# Patient Record
Sex: Female | Born: 1970 | Race: White | Hispanic: No | Marital: Single | Smoking: Never smoker
Health system: Southern US, Community
[De-identification: ages and names within clinical notes are randomized; demographics above are authoritative.]

## PROBLEM LIST (undated history)

## (undated) DIAGNOSIS — E079 Disorder of thyroid, unspecified: Secondary | ICD-10-CM

## (undated) DIAGNOSIS — E039 Hypothyroidism, unspecified: Secondary | ICD-10-CM

## (undated) DIAGNOSIS — I10 Essential (primary) hypertension: Secondary | ICD-10-CM

## (undated) HISTORY — PX: THYROIDECTOMY: SHX17

---

## 1997-06-26 ENCOUNTER — Inpatient Hospital Stay (HOSPITAL_COMMUNITY): Admission: AD | Admit: 1997-06-26 | Discharge: 1997-06-26 | Payer: Self-pay | Admitting: Obstetrics and Gynecology

## 1997-08-11 ENCOUNTER — Inpatient Hospital Stay (HOSPITAL_COMMUNITY): Admission: AD | Admit: 1997-08-11 | Discharge: 1997-08-11 | Payer: Self-pay | Admitting: Obstetrics and Gynecology

## 1997-09-05 ENCOUNTER — Inpatient Hospital Stay (HOSPITAL_COMMUNITY): Admission: AD | Admit: 1997-09-05 | Discharge: 1997-09-05 | Payer: Self-pay | Admitting: *Deleted

## 1997-09-07 ENCOUNTER — Inpatient Hospital Stay (HOSPITAL_COMMUNITY): Admission: AD | Admit: 1997-09-07 | Discharge: 1997-09-09 | Payer: Self-pay | Admitting: Obstetrics and Gynecology

## 1997-10-12 ENCOUNTER — Other Ambulatory Visit: Admission: RE | Admit: 1997-10-12 | Discharge: 1997-10-12 | Payer: Self-pay | Admitting: Obstetrics and Gynecology

## 1998-07-29 ENCOUNTER — Emergency Department (HOSPITAL_COMMUNITY): Admission: EM | Admit: 1998-07-29 | Discharge: 1998-07-29 | Payer: Self-pay | Admitting: Emergency Medicine

## 1999-01-09 ENCOUNTER — Other Ambulatory Visit: Admission: RE | Admit: 1999-01-09 | Discharge: 1999-01-09 | Payer: Self-pay | Admitting: Obstetrics and Gynecology

## 1999-03-15 ENCOUNTER — Other Ambulatory Visit: Admission: RE | Admit: 1999-03-15 | Discharge: 1999-03-15 | Payer: Self-pay | Admitting: *Deleted

## 1999-05-09 ENCOUNTER — Other Ambulatory Visit: Admission: RE | Admit: 1999-05-09 | Discharge: 1999-05-09 | Payer: Self-pay | Admitting: *Deleted

## 1999-06-01 ENCOUNTER — Ambulatory Visit (HOSPITAL_COMMUNITY): Admission: RE | Admit: 1999-06-01 | Discharge: 1999-06-01 | Payer: Self-pay | Admitting: *Deleted

## 1999-06-01 ENCOUNTER — Encounter (INDEPENDENT_AMBULATORY_CARE_PROVIDER_SITE_OTHER): Payer: Self-pay | Admitting: *Deleted

## 2000-01-15 ENCOUNTER — Other Ambulatory Visit: Admission: RE | Admit: 2000-01-15 | Discharge: 2000-01-15 | Payer: Self-pay | Admitting: Obstetrics and Gynecology

## 2000-03-18 ENCOUNTER — Ambulatory Visit (HOSPITAL_COMMUNITY): Admission: RE | Admit: 2000-03-18 | Discharge: 2000-03-18 | Payer: Self-pay | Admitting: *Deleted

## 2000-07-27 ENCOUNTER — Emergency Department (HOSPITAL_COMMUNITY): Admission: EM | Admit: 2000-07-27 | Discharge: 2000-07-27 | Payer: Self-pay | Admitting: *Deleted

## 2000-10-13 ENCOUNTER — Other Ambulatory Visit: Admission: RE | Admit: 2000-10-13 | Discharge: 2000-10-13 | Payer: Self-pay | Admitting: Obstetrics and Gynecology

## 2001-01-28 ENCOUNTER — Emergency Department (HOSPITAL_COMMUNITY): Admission: EM | Admit: 2001-01-28 | Discharge: 2001-01-28 | Payer: Self-pay | Admitting: Emergency Medicine

## 2001-04-22 ENCOUNTER — Inpatient Hospital Stay (HOSPITAL_COMMUNITY): Admission: AD | Admit: 2001-04-22 | Discharge: 2001-04-22 | Payer: Self-pay | Admitting: Obstetrics and Gynecology

## 2001-04-29 ENCOUNTER — Inpatient Hospital Stay (HOSPITAL_COMMUNITY): Admission: AD | Admit: 2001-04-29 | Discharge: 2001-04-29 | Payer: Self-pay | Admitting: Obstetrics and Gynecology

## 2001-05-05 ENCOUNTER — Inpatient Hospital Stay (HOSPITAL_COMMUNITY): Admission: AD | Admit: 2001-05-05 | Discharge: 2001-05-07 | Payer: Self-pay | Admitting: Obstetrics and Gynecology

## 2001-06-12 ENCOUNTER — Other Ambulatory Visit: Admission: RE | Admit: 2001-06-12 | Discharge: 2001-06-12 | Payer: Self-pay | Admitting: Obstetrics and Gynecology

## 2002-08-31 ENCOUNTER — Other Ambulatory Visit: Admission: RE | Admit: 2002-08-31 | Discharge: 2002-08-31 | Payer: Self-pay | Admitting: Family Medicine

## 2003-02-02 ENCOUNTER — Encounter: Admission: RE | Admit: 2003-02-02 | Discharge: 2003-02-02 | Payer: Self-pay | Admitting: Family Medicine

## 2003-02-02 ENCOUNTER — Encounter: Payer: Self-pay | Admitting: Family Medicine

## 2003-03-03 ENCOUNTER — Ambulatory Visit (HOSPITAL_COMMUNITY): Admission: RE | Admit: 2003-03-03 | Discharge: 2003-03-03 | Payer: Self-pay | Admitting: Family Medicine

## 2003-03-03 ENCOUNTER — Encounter (INDEPENDENT_AMBULATORY_CARE_PROVIDER_SITE_OTHER): Payer: Self-pay | Admitting: *Deleted

## 2003-04-20 ENCOUNTER — Encounter (INDEPENDENT_AMBULATORY_CARE_PROVIDER_SITE_OTHER): Payer: Self-pay | Admitting: Specialist

## 2003-04-20 ENCOUNTER — Ambulatory Visit (HOSPITAL_COMMUNITY): Admission: RE | Admit: 2003-04-20 | Discharge: 2003-04-21 | Payer: Self-pay | Admitting: General Surgery

## 2003-12-11 ENCOUNTER — Emergency Department (HOSPITAL_COMMUNITY): Admission: EM | Admit: 2003-12-11 | Discharge: 2003-12-12 | Payer: Self-pay | Admitting: Emergency Medicine

## 2004-01-05 ENCOUNTER — Ambulatory Visit (HOSPITAL_COMMUNITY): Admission: RE | Admit: 2004-01-05 | Discharge: 2004-01-05 | Payer: Self-pay | Admitting: Family Medicine

## 2005-01-29 ENCOUNTER — Encounter: Admission: RE | Admit: 2005-01-29 | Discharge: 2005-01-29 | Payer: Self-pay | Admitting: Family Medicine

## 2005-03-19 ENCOUNTER — Encounter: Admission: RE | Admit: 2005-03-19 | Discharge: 2005-04-10 | Payer: Self-pay | Admitting: Orthopedic Surgery

## 2006-08-31 ENCOUNTER — Emergency Department (HOSPITAL_COMMUNITY): Admission: EM | Admit: 2006-08-31 | Discharge: 2006-08-31 | Payer: Self-pay | Admitting: Emergency Medicine

## 2007-10-19 ENCOUNTER — Other Ambulatory Visit: Admission: RE | Admit: 2007-10-19 | Discharge: 2007-10-19 | Payer: Self-pay | Admitting: Family Medicine

## 2009-03-11 ENCOUNTER — Emergency Department (HOSPITAL_COMMUNITY): Admission: EM | Admit: 2009-03-11 | Discharge: 2009-03-11 | Payer: Self-pay | Admitting: Emergency Medicine

## 2009-11-02 ENCOUNTER — Other Ambulatory Visit: Admission: RE | Admit: 2009-11-02 | Discharge: 2009-11-02 | Payer: Self-pay | Admitting: Family Medicine

## 2009-11-28 ENCOUNTER — Emergency Department (HOSPITAL_COMMUNITY): Admission: EM | Admit: 2009-11-28 | Discharge: 2009-11-28 | Payer: Self-pay | Admitting: Emergency Medicine

## 2010-09-07 NOTE — Op Note (Signed)
NAME:  [name], [name]                          ACCOUNT NO.:  [id number]   MEDICAL RECORD NO.:  [id number]                   PATIENT TYPE:  OIB   LOCATION:  2550                                 FACILITY:  [facility]   PHYSICIAN:  Leonie Man, M.D.                DATE OF BIRTH:  05/31/1970   DATE OF PROCEDURE:  04/20/2003  DATE OF DISCHARGE:                                 OPERATIVE REPORT   PREOPERATIVE DIAGNOSIS:  Multinodular goiter with substernal goiter.   POSTOPERATIVE DIAGNOSIS:  Multinodular goiter with substernal goiter.   OPERATION PERFORMED:  Subtotal thyroidectomy.   SURGEON:  Leonie Man, M.D.   ASSISTANT:  Joanne Gavel, M.D.   CONSULTATION:  Ines Bloomer, M.D.   ANESTHESIA:  General.   INDICATIONS FOR PROCEDURE:  The patient is a 40 year old woman with a  continually enlarging mass in her neck which on chest x-ray shows the mass  to be deviating the trachea significantly to the right.  On examination, she  has a very full neck with bilateral goiter.  Fine needle aspiration of the  largest portion of the mass shows it to be consistent with a multinodular  goiter.  She comes to the operating room now after the risks and potential  benefits of surgery had all fully been discussed.  All questions answered  and consent obtained.   DESCRIPTION OF PROCEDURE:  Following the induction of satisfactory general  anesthesia, with the patient positioned supinely, head and neck  hyperextended, the anterior chest and neck were prepped and draped to be  included in a sterile operative field.  A collar incision was carried down  through the skin and subcutaneous tissues, deepening this to the cervical  platysmal muscle carrying it down to the strap muscles.  The superior flap  was raised up to the thyroid cartilage and an inferior flap carried down to  the sternal notch.  The strap muscles were opened up in the midline carrying  the dissection again up to the thyroid cartilage  and down to the sternal  notch.  The strap muscles were retracted laterally on the left side.  Dissecting the thyroid out and the thyroid was extremely large extending  well up into the neck and extending down into the substernal area going  anteriorly.  The left lobe was dissected free carrying the dissection up  first to the upper pole of the thyroid where the upper pole vessels were  doubly clamped and doubly tied with 0 silk sutures.  Dissection was carried  down staying close onto the capsule of the thyroid.  At the inferior portion  of the thyroid where it went substernally, it was not easy to roll the  thyroid medially and to recognize the nerves. We then turned to the right  lobe of the thyroid which was somewhat more easily mobilized, elevated  taking the upper pole vessels between the clamps and securing  them with ties  of 0 silk.  Hemostasis was maintained throughout that dissection with clips  or ties of 3-0 silk.  Staying close onto the capsule of the thyroid.  Dissection was carried down through the region of the goiter. The goiter was  carried medially over to the midline of the trachea.  The isthmus was taken  and dissected free using straight clamps, the thyroid was crossclamped along  the isthmus removing the entire right lobe of the thyroid.  The inferior  parathyroid glands were well recognized and was protected throughout the  course of the dissection.  This portion of the thyroid thus removed.  The  portion of the thyroid at the left lobe was then dissected off of the  trachea and carried down toward the sternal notch where it was also  transected and removed and forwarded for pathologic evaluation.  This left  the substernal portion only which went more anteriorly then than is usual.  In dissecting this, the entire thing was somewhat sticky.  I asked Dr.  Karle Plumber to come into the room and he came in and gave an opinion with  respect to the substernal aspect of  the thyroid and we persisted in  dissecting down through the anterior aspect of the thyroid taking the  intervening fibrous tissue and elevating the entire substernal mass from off  of the trachea from behind the sternal head.  This was also forwarded for  pathologic evaluation.  All areas of dissection were then checked thoroughly  for hemostasis.  Additional bleeding points treated with electrocautery over  the clips and ties where appropriate.  Sponge, instrument and sharp counts  were then verified.  The wound was thoroughly irrigated with normal saline  and the midline was closed with a running suture of 3-0 Vicryl.  The  subcutaneous tissue were similarly checked for bleeding and additional  bleeding points treated with electrocautery.  The subcutaneous tissues and  platysma muscle were closed with interrupted 3-0 Vicryl sutures.  The skin  was then closed with a 5-0 Monocryl suture and then reinforced with Steri-  Strips.  In reversal of the anesthetic, the anesthesiologist was asked to  use a fiberoptic scope to view the larynx and the cords were noted to be  contracting normally.  Sterile dressings were applied to the neck wound.  Anesthetic was fully reversed and the patient removed from the operating  room to the recovery room in stable condition.  She tolerated the procedure  well.                                               Leonie Man, M.D.    PB/MEDQ  D:  04/20/2003  T:  04/20/2003  Job:  [id number]

## 2010-09-07 NOTE — H&P (Signed)
[facility]  PatientREAGANN, [name] Visit Number: [id number] MRN: [id number]          Service Type: OBS Location: 910A 9134 01 Attending Physician:  Lenoard Aden Dictated by:   Lenoard Aden, M.D. Admit Date:  05/05/2001   CC:         [facility]                         History and Physical  REASON FOR INDUCTION:         Large for dates, history of precipitous labor, positive GBS.  HISTORY OF PRESENT ILLNESS:   A 40 year old white female G63, P2 EDD May 14, 2001 at 38+ weeks with advanced dilatation, history of precipitous labor, and GBS positivity, probable macrosomia noted.  PAST OBSTETRICAL HISTORY:     SAB in 1994, an 8 pound 12 ounce female in 1995, an 8 pound 6 ounces female in 1999.  ALLERGIES:                    No known drug allergies.  MEDICATIONS:                  Prenatal vitamins.  PAST MEDICAL HISTORY:         Remarkable for gestational diabetes and history of thyroid nodule on Synthroid suppression.  OBSTETRIC LABORATORY WORK:    O+, rubella immune, hepatitis B surface antigen negative, HIV nonreactive.  GC and chlamydia negative.  Prenatal course complicated by preterm labor, presumed macrosomia.  PHYSICAL EXAMINATION:  GENERAL:                      A well-developed, well-nourished white female in no apparent distress.  HEENT:                        Normal.  LUNGS:                        Clear.  HEART:                        Regular rate and rhythm.  ABDOMEN:                      Soft, gravid, nontender.  Estimated fetal weight 8-1/2 pounds.  The cervix is 3 cm, thick, vertex, -1 to -2.  EXTREMITIES:                  No cords.  NEUROLOGIC:                   Nonfocal.  IMPRESSION:                   1. A 38-week intrauterine pregnancy.                               2. History of precipitous labor.                               3. Rule out large for gestational age.  4.  Group B Strep positive.                               5. History of precipitous labor.  PLAN:                         Proceed with induction, antepartum prophylaxis. Anticipate vaginal delivery. Dictated by:   Lenoard Aden, M.D. Attending Physician:  Lenoard Aden DD:  05/05/01 TD:  05/05/01 Job: [id number]

## 2010-09-07 NOTE — H&P (Signed)
[facility]  Patient:    [name], [name] Visit Number: [id number] MRN: [id number]          Service Type: OBS Location: [facility] Attending Physician:  Lenoard Aden Dictated by:   Lenoard Aden, M.D. Admit Date:  04/22/2001                           History and Physical  CHIEF COMPLAINT:              Labor.  HISTORY OF PRESENT ILLNESS:   The patient is a 40 year old white female G 4, P2, 0, 1, 2, with an EDD of May 14, 2001, at 36+ weeks, who presents for an evaluation of labor.  PAST OBSTETRIC HISTORY:       1. D&C for SAB in 1994.                               2. A vaginal delivery in 1995.                               3. A vaginal delivery in ____.  Both                                  pregnancies complicated by gestational                                  diabetes mellitus.  PAST MEDICAL HISTORY:         No other medical or surgical hospitalizations.  FAMILY HISTORY:               Breast, lung, and cervical cancer.  Family history of diabetes mellitus, emphysema, asthma, and coronary artery disease.  PRENATAL LABORATORY DATA:     Blood type O-positive, rubella immune, and group-B Streptococcus positive.  The pregnancy was complicated by a multinodular goiter which has been benign on followup.  Normal glucose testing and preterm cervical change.  PHYSICAL EXAMINATION  GENERAL:                      She is a comfortable-appearing white female, in no apparent distress.  HEENT:                        Normal.  LUNGS:                        Clear.  HEART:                        Regular rhythm.  ABDOMEN:                      Soft, gravid, nontender.  PELVIC:                       Cervix is 2 to 3 cm, 50% vertex, -2.  EXTREMITIES:                  No cords.  NEUROLOGIC:  Nonfocal.  SKIN:                         Intact.  PSYCHIATRIC:                  Mood and affect appropriate.  NST is reactive with  irregular contractions.  The cervix is unchanged as noted.  After an hour of ambulation, the patient elects to be discharged.  IMPRESSION:                   1. A 36-week intrauterine pregnancy.                               2. Prodromal labor.  PLAN:                         Discharge home.  Labor precautions given. Followup in the office within one week.  Fetal activity discussed. Dictated by:   Lenoard Aden, M.D. Attending Physician:  Lenoard Aden DD:  04/22/01 TD:  04/22/01 Job: [id number]

## 2010-09-07 NOTE — Consult Note (Signed)
NAME:  [name], [name]                          ACCOUNT NO.:  [id number]   MEDICAL RECORD NO.:  [id number]                   PATIENT TYPE:  OIB   LOCATION:  2550                                 FACILITY:  [facility]   PHYSICIAN:  Ines Bloomer, M.D.              DATE OF BIRTH:  05/27/1970   DATE OF CONSULTATION:  DATE OF DISCHARGE:                                   CONSULTATION   PREOPERATIVE DIAGNOSIS:  Thyroid mass.   POSTOPERATIVE DIAGNOSIS:  Substernal thyroid goiter.   REASON FOR CONSULTATION:  I was called to the operating room by Dr. Leonie Man for an intraoperative consult on Lekesha Flores. Wynia.  This 40 year old  African-American female was found to have a large thyroid goiter.  Because  of the size and symptoms of tracheal compression, she was brought to the  operating room for excision. The right lobe of the thyroid was excised but  the left of the thyroid went down substernally.  It did not go down along  the trachea in the usual position but was more directly retrosternal on the  left side near the innominate vein.  Because of the possibility of  complications, I was asked to see the patient.  I scrubbed in and did an  evaluation of the patient and this time Dr. Lurene Shadow and Dr. Wiliam Ke were able  to dissect the border which was at least 4-5 cm in diameter up from the  substernal place. There was no evidence of any bleeding.  Once this had been  accomplished, I left the operating room theater.                                               Ines Bloomer, M.D.    DPB/MEDQ  D:  04/20/2003  T:  04/20/2003  Job:  [id number]

## 2012-06-01 ENCOUNTER — Other Ambulatory Visit (HOSPITAL_COMMUNITY)
Admission: RE | Admit: 2012-06-01 | Discharge: 2012-06-01 | Disposition: A | Discharge status: Home / Self Care | Payer: Self-pay | Source: Ambulatory Visit | Attending: Family Medicine | Admitting: Family Medicine

## 2012-06-01 ENCOUNTER — Other Ambulatory Visit: Payer: Self-pay | Admitting: Physician Assistant

## 2012-06-01 DIAGNOSIS — Z0000 Encounter for general adult medical examination without abnormal findings: Secondary | ICD-10-CM | POA: Insufficient documentation

## 2012-06-11 ENCOUNTER — Other Ambulatory Visit: Payer: Self-pay | Admitting: Physician Assistant

## 2013-03-01 ENCOUNTER — Encounter (HOSPITAL_COMMUNITY): Payer: Self-pay | Admitting: Emergency Medicine

## 2013-03-01 ENCOUNTER — Emergency Department (HOSPITAL_COMMUNITY)
Admission: EM | Admit: 2013-03-01 | Discharge: 2013-03-02 | Disposition: A | Discharge status: Home / Self Care | Payer: Managed Care, Other (non HMO) | Attending: Emergency Medicine | Admitting: Emergency Medicine

## 2013-03-01 ENCOUNTER — Emergency Department (HOSPITAL_COMMUNITY): Payer: Managed Care, Other (non HMO)

## 2013-03-01 DIAGNOSIS — Y92009 Unspecified place in unspecified non-institutional (private) residence as the place of occurrence of the external cause: Secondary | ICD-10-CM | POA: Insufficient documentation

## 2013-03-01 DIAGNOSIS — S63619A Unspecified sprain of unspecified finger, initial encounter: Secondary | ICD-10-CM

## 2013-03-01 DIAGNOSIS — W010XXA Fall on same level from slipping, tripping and stumbling without subsequent striking against object, initial encounter: Secondary | ICD-10-CM | POA: Insufficient documentation

## 2013-03-01 DIAGNOSIS — S8002XA Contusion of left knee, initial encounter: Secondary | ICD-10-CM

## 2013-03-01 DIAGNOSIS — W19XXXA Unspecified fall, initial encounter: Secondary | ICD-10-CM

## 2013-03-01 DIAGNOSIS — S8000XA Contusion of unspecified knee, initial encounter: Secondary | ICD-10-CM | POA: Insufficient documentation

## 2013-03-01 DIAGNOSIS — E079 Disorder of thyroid, unspecified: Secondary | ICD-10-CM | POA: Insufficient documentation

## 2013-03-01 DIAGNOSIS — Y9301 Activity, walking, marching and hiking: Secondary | ICD-10-CM | POA: Insufficient documentation

## 2013-03-01 DIAGNOSIS — S6390XA Sprain of unspecified part of unspecified wrist and hand, initial encounter: Secondary | ICD-10-CM | POA: Insufficient documentation

## 2013-03-01 HISTORY — DX: Disorder of thyroid, unspecified: E07.9

## 2013-03-01 NOTE — ED Notes (Signed)
Pt. tripped and fell at home this evening , no LOC / ambulatory, reports pain at left knee and left 5th finger.

## 2013-03-02 MED ORDER — HYDROCODONE-ACETAMINOPHEN 5-325 MG PO TABS
1.0000 | ORAL_TABLET | Freq: Once | ORAL | Status: AC
  Administered 2013-03-02: 1 via ORAL
  Filled 2013-03-02: qty 1

## 2013-03-02 NOTE — ED Provider Notes (Signed)
Medical screening examination/treatment/procedure(s) were performed by non-physician practitioner and as supervising physician I was immediately available for consultation/collaboration.  EKG Interpretation   None         Takai Chiaramonte, MD 03/02/13 0553 

## 2013-03-02 NOTE — ED Provider Notes (Signed)
CSN: [id number]     Arrival date & time 03/01/13  2300 History   First MD Initiated Contact with Patient 03/01/13 2340     Chief Complaint  Patient presents with  . Knee Pain   HPI  History provided by the patient. Patient is a 42 year old female who presents with left knee pain after fall. Patient was walking through the yard and reports that she may have tripped over a twice slide causing her to land directly on her left knee. She also has slight pain in injury to her left fifth finger. Pain is greatest in the knee. It is worse with bearing weight and ambulating. She denies any weakness or numbness to the foot. She has not used any treatments or medications for her symptoms. No other aggravating or alleviating factors. No other associated symptoms. No head injury or LOC.    Past Medical History  Diagnosis Date  . Thyroid disease    Past Surgical History  Procedure Laterality Date  . Thyroidectomy     No family history on file. History  Substance Use Topics  . Smoking status: Never Smoker   . Smokeless tobacco: Not on file  . Alcohol Use: No   OB History   Grav Para Term Preterm Abortions TAB SAB Ect Mult Living                 Review of Systems  Musculoskeletal: Negative for back pain and neck pain.  Neurological: Negative for weakness, numbness and headaches.  All other systems reviewed and are negative.    Allergies  Review of patient's allergies indicates no known allergies.  Home Medications  No current outpatient prescriptions on file. BP 145/92  Pulse 74  Temp(Src) 97.7 F (36.5 C) (Oral)  Resp 14  Wt 236 lb (107.049 kg)  SpO2 97%  LMP 02/21/2013 Physical Exam  Nursing note and vitals reviewed. Constitutional: She is oriented to person, place, and time. She appears well-developed and well-nourished. No distress.  HENT:  Head: Normocephalic and atraumatic.  Neck: Normal range of motion. Neck supple.  Cardiovascular: Normal rate and regular rhythm.    Pulmonary/Chest: Effort normal and breath sounds normal. No respiratory distress. She has no wheezes. She has no rales.  Musculoskeletal: She exhibits no tenderness.  Small contusion over anterior left knee. Mild swelling. No gross deformities. Pain with range of motion otherwise full. No increased laxity with varus stress. Negative anterior posterior drawer test. Normal distal sensations and pulses and foot.  Mild pain with range of motion of the left fifth digit of hand. There is no swelling or deformity. Normal sensations and capillary refill.  Neurological: She is alert and oriented to person, place, and time.  Skin: Skin is warm and dry. No rash noted.  Psychiatric: She has a normal mood and affect. Her behavior is normal.    ED Course  Procedures   COORDINATION OF CARE:  Nursing notes reviewed. Vital signs reviewed. Initial pt interview and examination performed.   12:18 AM-patient seen and evaluated. She appears well no acute distress. Discussed and reviewed x-rays with the patient in the room. No obvious signs of fractures, dislocations or other concerning injuries. Will provide Ace wrap and ice pack. Patient instructed on rice therapy. She will be given one Vicodin here and instructed to use NSAIDs at home.   Imaging Review Dg Knee Complete 4 Views Left  03/02/2013   CLINICAL DATA:  Left knee pain following a fall.  EXAM: LEFT KNEE - COMPLETE 4+  VIEW  COMPARISON:  None.  FINDINGS: There is no evidence of fracture, dislocation, or joint effusion. There is no evidence of arthropathy or other focal bone abnormality. Soft tissues are unremarkable.  IMPRESSION: Normal examination.   Electronically Signed   By: Gordan Payment M.D.   On: 03/02/2013 00:17   Dg Finger Little Left  03/02/2013   CLINICAL DATA:  Left little finger pain in the region of the MCP joint following a fall.  EXAM: LEFT LITTLE FINGER 2+V  COMPARISON:  None.  FINDINGS: There is no evidence of fracture or dislocation.  There is no evidence of arthropathy or other focal bone abnormality. Soft tissues are unremarkable.  IMPRESSION: Normal examination.   Electronically Signed   By: Gordan Payment M.D.   On: 03/02/2013 00:09      MDM   1. Contusion of knee, left, initial encounter   2. Fall, initial encounter   3. Finger sprain, initial encounter        Angus Seller, PA-C 03/02/13 0150

## 2013-03-02 NOTE — ED Notes (Signed)
Peter PA made aware of pts BP

## 2013-05-29 ENCOUNTER — Encounter (HOSPITAL_COMMUNITY): Payer: Self-pay | Admitting: Emergency Medicine

## 2013-05-29 ENCOUNTER — Emergency Department (HOSPITAL_COMMUNITY)
Admission: EM | Admit: 2013-05-29 | Discharge: 2013-05-29 | Disposition: A | Discharge status: Home / Self Care | Payer: Managed Care, Other (non HMO) | Attending: Emergency Medicine | Admitting: Emergency Medicine

## 2013-05-29 ENCOUNTER — Emergency Department (HOSPITAL_COMMUNITY): Payer: Managed Care, Other (non HMO)

## 2013-05-29 DIAGNOSIS — J159 Unspecified bacterial pneumonia: Secondary | ICD-10-CM | POA: Insufficient documentation

## 2013-05-29 DIAGNOSIS — Z79899 Other long term (current) drug therapy: Secondary | ICD-10-CM | POA: Insufficient documentation

## 2013-05-29 DIAGNOSIS — E079 Disorder of thyroid, unspecified: Secondary | ICD-10-CM | POA: Insufficient documentation

## 2013-05-29 DIAGNOSIS — R141 Gas pain: Secondary | ICD-10-CM | POA: Insufficient documentation

## 2013-05-29 DIAGNOSIS — R143 Flatulence: Secondary | ICD-10-CM

## 2013-05-29 DIAGNOSIS — R197 Diarrhea, unspecified: Secondary | ICD-10-CM | POA: Insufficient documentation

## 2013-05-29 DIAGNOSIS — R1011 Right upper quadrant pain: Secondary | ICD-10-CM | POA: Insufficient documentation

## 2013-05-29 DIAGNOSIS — R1110 Vomiting, unspecified: Secondary | ICD-10-CM

## 2013-05-29 DIAGNOSIS — R142 Eructation: Secondary | ICD-10-CM | POA: Insufficient documentation

## 2013-05-29 DIAGNOSIS — J189 Pneumonia, unspecified organism: Secondary | ICD-10-CM

## 2013-05-29 DIAGNOSIS — Z3202 Encounter for pregnancy test, result negative: Secondary | ICD-10-CM | POA: Insufficient documentation

## 2013-05-29 DIAGNOSIS — R112 Nausea with vomiting, unspecified: Secondary | ICD-10-CM | POA: Insufficient documentation

## 2013-05-29 LAB — URINALYSIS, ROUTINE W REFLEX MICROSCOPIC
BILIRUBIN URINE: NEGATIVE
GLUCOSE, UA: NEGATIVE mg/dL
HGB URINE DIPSTICK: NEGATIVE
KETONES UR: NEGATIVE mg/dL
LEUKOCYTES UA: NEGATIVE
NITRITE: NEGATIVE
Protein, ur: NEGATIVE mg/dL
Specific Gravity, Urine: 1.02 (ref 1.005–1.030)
UROBILINOGEN UA: 0.2 mg/dL (ref 0.0–1.0)
pH: 7 (ref 5.0–8.0)

## 2013-05-29 LAB — COMPREHENSIVE METABOLIC PANEL
ALK PHOS: 109 U/L (ref 39–117)
ALT: 14 U/L (ref 0–35)
AST: 16 U/L (ref 0–37)
Albumin: 3.3 g/dL — ABNORMAL LOW (ref 3.5–5.2)
BILIRUBIN TOTAL: 0.3 mg/dL (ref 0.3–1.2)
BUN: 8 mg/dL (ref 6–23)
CALCIUM: 8.4 mg/dL (ref 8.4–10.5)
CO2: 25 mEq/L (ref 19–32)
Chloride: 101 mEq/L (ref 96–112)
Creatinine, Ser: 0.71 mg/dL (ref 0.50–1.10)
GFR calc non Af Amer: >90 mL/min (ref >90)
Glucose, Bld: 83 mg/dL (ref 70–99)
POTASSIUM: 3.9 meq/L (ref 3.7–5.3)
SODIUM: 138 meq/L (ref 137–147)
TOTAL PROTEIN: 7.5 g/dL (ref 6.0–8.3)

## 2013-05-29 LAB — CBC WITH DIFFERENTIAL/PLATELET
BASOS ABS: 0 10*3/uL (ref 0.0–0.1)
Basophils Relative: 0 % (ref 0–1)
Eosinophils Absolute: 0.2 10*3/uL (ref 0.0–0.7)
Eosinophils Relative: 2 % (ref 0–5)
HEMATOCRIT: 36.9 % (ref 36.0–46.0)
Hemoglobin: 11.9 g/dL — ABNORMAL LOW (ref 12.0–15.0)
LYMPHS PCT: 22 % (ref 12–46)
Lymphs Abs: 2.3 10*3/uL (ref 0.7–4.0)
MCH: 25.2 pg — AB (ref 26.0–34.0)
MCHC: 32.2 g/dL (ref 30.0–36.0)
MCV: 78 fL (ref 78.0–100.0)
MONO ABS: 0.6 10*3/uL (ref 0.1–1.0)
MONOS PCT: 5 % (ref 3–12)
NEUTROS PCT: 71 % (ref 43–77)
Neutro Abs: 7.4 10*3/uL (ref 1.7–7.7)
Platelets: 323 10*3/uL (ref 150–400)
RBC: 4.73 MIL/uL (ref 3.87–5.11)
RDW: 15.6 % — ABNORMAL HIGH (ref 11.5–15.5)
WBC: 10.4 10*3/uL (ref 4.0–10.5)

## 2013-05-29 LAB — LIPASE, BLOOD: LIPASE: 29 U/L (ref 11–59)

## 2013-05-29 LAB — POCT PREGNANCY, URINE: PREG TEST UR: NEGATIVE

## 2013-05-29 MED ORDER — IOHEXOL 300 MG/ML  SOLN
100.0000 mL | Freq: Once | INTRAMUSCULAR | Status: AC | PRN
  Administered 2013-05-29: 100 mL via INTRAVENOUS

## 2013-05-29 MED ORDER — ONDANSETRON 4 MG PO TBDP: 4.0000 mg | ORAL_TABLET | Freq: Three times a day (TID) | ORAL | Status: DC | PRN

## 2013-05-29 MED ORDER — IOHEXOL 300 MG/ML  SOLN
20.0000 mL | INTRAMUSCULAR | Status: AC
  Administered 2013-05-29: 20 mL via ORAL

## 2013-05-29 MED ORDER — AZITHROMYCIN 250 MG PO TABS: 250.0000 mg | ORAL_TABLET | Freq: Every day | ORAL | Status: DC

## 2013-05-29 MED ORDER — MORPHINE SULFATE 4 MG/ML IJ SOLN
4.0000 mg | Freq: Once | INTRAMUSCULAR | Status: AC
  Administered 2013-05-29: 4 mg via INTRAVENOUS
  Filled 2013-05-29: qty 1

## 2013-05-29 MED ORDER — ONDANSETRON HCL 4 MG/2ML IJ SOLN
4.0000 mg | Freq: Once | INTRAMUSCULAR | Status: AC
  Administered 2013-05-29: 4 mg via INTRAVENOUS
  Filled 2013-05-29: qty 2

## 2013-05-29 MED ORDER — HYDROCODONE-ACETAMINOPHEN 5-325 MG PO TABS: 1.0000 | ORAL_TABLET | Freq: Four times a day (QID) | ORAL | Status: DC | PRN

## 2013-05-29 MED ORDER — SODIUM CHLORIDE 0.9 % IV BOLUS (SEPSIS)
1000.0000 mL | Freq: Once | INTRAVENOUS | Status: AC
  Administered 2013-05-29: 1000 mL via INTRAVENOUS

## 2013-05-29 NOTE — ED Provider Notes (Signed)
Medications  iohexol (OMNIPAQUE) 300 MG/ML solution 20 mL (20 mLs Oral Contrast Given 05/29/13 1759)  morphine 4 MG/ML injection 4 mg (not administered)  ondansetron (ZOFRAN) injection 4 mg (4 mg Intravenous Given 05/29/13 1505)  morphine 4 MG/ML injection 4 mg (4 mg Intravenous Given 05/29/13 1505)  sodium chloride 0.9 % bolus 1,000 mL (1,000 mLs Intravenous Restarted 05/29/13 1924)  morphine 4 MG/ML injection 4 mg (4 mg Intravenous Given 05/29/13 1826)  iohexol (OMNIPAQUE) 300 MG/ML solution 100 mL (100 mLs Intravenous Contrast Given 05/29/13 1931)   Labs Reviewed  COMPREHENSIVE METABOLIC PANEL - Abnormal; Notable for the following:    Albumin 3.3 (*)    All other components within normal limits  CBC WITH DIFFERENTIAL - Abnormal; Notable for the following:    Hemoglobin 11.9 (*)    MCH 25.2 (*)    RDW 15.6 (*)    All other components within normal limits  URINALYSIS, ROUTINE W REFLEX MICROSCOPIC  LIPASE, BLOOD  POCT PREGNANCY, URINE   Results for orders placed during the hospital encounter of 05/29/13  COMPREHENSIVE METABOLIC PANEL      Result Value Range   Sodium 138  137 - 147 mEq/L   Potassium 3.9  3.7 - 5.3 mEq/L   Chloride 101  96 - 112 mEq/L   CO2 25  19 - 32 mEq/L   Glucose, Bld 83  70 - 99 mg/dL   BUN 8  6 - 23 mg/dL   Creatinine, Ser 0.71  0.50 - 1.10 mg/dL   Calcium 8.4  8.4 - 10.5 mg/dL   Total Protein 7.5  6.0 - 8.3 g/dL   Albumin 3.3 (*) 3.5 - 5.2 g/dL   AST 16  0 - 37 U/L   ALT 14  0 - 35 U/L   Alkaline Phosphatase 109  39 - 117 U/L   Total Bilirubin 0.3  0.3 - 1.2 mg/dL   GFR calc non Af Amer >90  >90 mL/min   GFR calc Af Amer >90  >90 mL/min  CBC WITH DIFFERENTIAL      Result Value Range   WBC 10.4  4.0 - 10.5 K/uL   RBC 4.73  3.87 - 5.11 MIL/uL   Hemoglobin 11.9 (*) 12.0 - 15.0 g/dL   HCT 36.9  36.0 - 46.0 %   MCV 78.0  78.0 - 100.0 fL   MCH 25.2 (*) 26.0 - 34.0 pg   MCHC 32.2  30.0 - 36.0 g/dL   RDW 15.6 (*) 11.5 - 15.5 %   Platelets 323  150 - 400 K/uL   Neutrophils Relative % 71  43 - 77 %   Neutro Abs 7.4  1.7 - 7.7 K/uL   Lymphocytes Relative 22  12 - 46 %   Lymphs Abs 2.3  0.7 - 4.0 K/uL   Monocytes Relative 5  3 - 12 %   Monocytes Absolute 0.6  0.1 - 1.0 K/uL   Eosinophils Relative 2  0 - 5 %   Eosinophils Absolute 0.2  0.0 - 0.7 K/uL   Basophils Relative 0  0 - 1 %   Basophils Absolute 0.0  0.0 - 0.1 K/uL  URINALYSIS, ROUTINE W REFLEX MICROSCOPIC      Result Value Range   Color, Urine YELLOW  YELLOW   APPearance CLEAR  CLEAR   Specific Gravity, Urine 1.020  1.005 - 1.030   pH 7.0  5.0 - 8.0   Glucose, UA NEGATIVE  NEGATIVE mg/dL   Hgb urine dipstick NEGATIVE  NEGATIVE   Bilirubin Urine NEGATIVE  NEGATIVE   Ketones, ur NEGATIVE  NEGATIVE mg/dL   Protein, ur NEGATIVE  NEGATIVE mg/dL   Urobilinogen, UA 0.2  0.0 - 1.0 mg/dL   Nitrite NEGATIVE  NEGATIVE   Leukocytes, UA NEGATIVE  NEGATIVE  LIPASE, BLOOD      Result Value Range   Lipase 29  11 - 59 U/L  POCT PREGNANCY, URINE      Result Value Range   Preg Test, Ur NEGATIVE  NEGATIVE   Results for orders placed during the hospital encounter of 05/29/13  COMPREHENSIVE METABOLIC PANEL      Result Value Range   Sodium 138  137 - 147 mEq/L   Potassium 3.9  3.7 - 5.3 mEq/L   Chloride 101  96 - 112 mEq/L   CO2 25  19 - 32 mEq/L   Glucose, Bld 83  70 - 99 mg/dL   BUN 8  6 - 23 mg/dL   Creatinine, Ser 0.71  0.50 - 1.10 mg/dL   Calcium 8.4  8.4 - 10.5 mg/dL   Total Protein 7.5  6.0 - 8.3 g/dL   Albumin 3.3 (*) 3.5 - 5.2 g/dL   AST 16  0 - 37 U/L   ALT 14  0 - 35 U/L   Alkaline Phosphatase 109  39 - 117 U/L   Total Bilirubin 0.3  0.3 - 1.2 mg/dL   GFR calc non Af Amer >90  >90 mL/min   GFR calc Af Amer >90  >90 mL/min  CBC WITH DIFFERENTIAL      Result Value Range   WBC 10.4  4.0 - 10.5 K/uL   RBC 4.73  3.87 - 5.11 MIL/uL   Hemoglobin 11.9 (*) 12.0 - 15.0 g/dL   HCT 36.9  36.0 - 46.0 %   MCV 78.0  78.0 - 100.0 fL   MCH 25.2 (*) 26.0 - 34.0 pg   MCHC 32.2  30.0 - 36.0  g/dL   RDW 15.6 (*) 11.5 - 15.5 %   Platelets 323  150 - 400 K/uL   Neutrophils Relative % 71  43 - 77 %   Neutro Abs 7.4  1.7 - 7.7 K/uL   Lymphocytes Relative 22  12 - 46 %   Lymphs Abs 2.3  0.7 - 4.0 K/uL   Monocytes Relative 5  3 - 12 %   Monocytes Absolute 0.6  0.1 - 1.0 K/uL   Eosinophils Relative 2  0 - 5 %   Eosinophils Absolute 0.2  0.0 - 0.7 K/uL   Basophils Relative 0  0 - 1 %   Basophils Absolute 0.0  0.0 - 0.1 K/uL  URINALYSIS, ROUTINE W REFLEX MICROSCOPIC      Result Value Range   Color, Urine YELLOW  YELLOW   APPearance CLEAR  CLEAR   Specific Gravity, Urine 1.020  1.005 - 1.030   pH 7.0  5.0 - 8.0   Glucose, UA NEGATIVE  NEGATIVE mg/dL   Hgb urine dipstick NEGATIVE  NEGATIVE   Bilirubin Urine NEGATIVE  NEGATIVE   Ketones, ur NEGATIVE  NEGATIVE mg/dL   Protein, ur NEGATIVE  NEGATIVE mg/dL   Urobilinogen, UA 0.2  0.0 - 1.0 mg/dL   Nitrite NEGATIVE  NEGATIVE   Leukocytes, UA NEGATIVE  NEGATIVE  LIPASE, BLOOD      Result Value Range   Lipase 29  11 - 59 U/L  POCT PREGNANCY, URINE      Result Value Range   Preg  Test, Ur NEGATIVE  NEGATIVE   Ct Abdomen Pelvis W Contrast  05/29/2013   CLINICAL DATA:  43 year old female with right abdominal and pelvic pain. Nausea and diarrhea.  EXAM: CT ABDOMEN AND PELVIS WITH CONTRAST  TECHNIQUE: Multidetector CT imaging of the abdomen and pelvis was performed using the standard protocol following bolus administration of intravenous contrast.  CONTRAST:  121mL OMNIPAQUE IOHEXOL 300 MG/ML  SOLN  FINDINGS: Ground-glass opacity within the right middle lobe may represent infection/ pneumonia.  The liver, spleen, pancreas, gallbladder, kidneys and adrenal glands are unremarkable.  The bowel, bladder and appendix are unremarkable. There is no evidence of bowel obstruction, abscess or pneumoperitoneum.  There is no evidence of free fluid, enlarged lymph nodes, biliary dilation or abdominal aortic aneurysm.  The uterus and adnexal regions are  within normal limits.  No acute or suspicious bony abnormalities are identified.  IMPRESSION: Right middle lobe ground-glass opacity possibly representing infection/pneumonia.  No acute or significant abnormality identified within the abdomen/pelvis.   Electronically Signed   By: Hassan Rowan M.D.   On: 05/29/2013 20:03    Patient care acquired from Elisha Headland, NP pending CT scan. Pain improved on re-evaluation. Lungs CTA bilaterally. Mild RUQ abdominal pain and right lower chest tenderness appreciated still noted. CT scan results discussed with patient, advised patient we will obtain CXR for further evaluation of question of PNA on CT scan. CXR unremarkable. Will treat for CAP. Will also provide symptomatic care as outpatient. Return precautions discussed. Patient is agreeable to plan. Patient is stable at time of discharge. Patient d/w with Dr. Curly Rim, agrees with plan.      [place], PA-C 05/30/13 0021

## 2013-05-29 NOTE — ED Notes (Signed)
MD at bedside. 

## 2013-05-29 NOTE — ED Notes (Addendum)
Explained to pt need to wait 20 minutes after pain meds-- understanding expressed.

## 2013-05-29 NOTE — ED Provider Notes (Signed)
CSN: [id number]     Arrival date & time 05/29/13  1314 History   First MD Initiated Contact with Patient 05/29/13 1357     Chief Complaint  Patient presents with  . Abdominal Pain   (Consider location/radiation/quality/duration/timing/severity/associated sxs/prior Treatment) Patient is a 43 y.o. female presenting with abdominal pain. The history is provided by the patient. No language interpreter was used.  Abdominal Pain Pain location:  RUQ Pain quality: aching   Pain severity:  Mild Duration:  3 days Progression:  Waxing and waning Context: not recent illness, not recent travel, not retching, not sick contacts and not suspicious food intake   Associated symptoms: diarrhea, flatus and nausea   Associated symptoms: no dysuria, no fatigue, no fever and no shortness of breath   Diarrhea:    Quality:  Semi-solid   Number of occurrences:  2 Nausea:    Severity:  Moderate   Duration:  3 days Risk factors: obesity   Pt is a 43 year old female who presents today with a history of abdominal pain for 3 days. She reports that she has had diarrhea, nausea and more gas than usual. She denies fever, chills, vomiting or suspicious food intake. She denies any history of IBS of inflammatory bowel problems. She reports some mild to moderate nausea. Her LMP was 05/19/2013 and has regular periods every month. She reports her pain is 5/10 at today's visit.    Past Medical History  Diagnosis Date  . Thyroid disease    Past Surgical History  Procedure Laterality Date  . Thyroidectomy     History reviewed. No pertinent family history. History  Substance Use Topics  . Smoking status: Never Smoker   . Smokeless tobacco: Not on file  . Alcohol Use: No   OB History   Grav Para Term Preterm Abortions TAB SAB Ect Mult Living                 Review of Systems  Constitutional: Negative for fever and fatigue.  Respiratory: Negative for shortness of breath.   Gastrointestinal: Positive for nausea,  abdominal pain, diarrhea and flatus.  Genitourinary: Negative for dysuria.  All other systems reviewed and are negative.    Allergies  Review of patient's allergies indicates no known allergies.  Home Medications   Current Outpatient Rx  Name  Route  Sig  Dispense  Refill  . levothyroxine (SYNTHROID, LEVOTHROID) 200 MCG tablet   Oral   Take 200 mcg by mouth daily before breakfast.         . Vitamin D, Ergocalciferol, (DRISDOL) 50000 UNITS CAPS capsule   Oral   Take 50,000 Units by mouth 2 (two) times a week. On Monday and wednesday         . azithromycin (ZITHROMAX Z-PAK) 250 MG tablet   Oral   Take 1 tablet (250 mg total) by mouth daily. 500mg  PO day 1, then 250mg  PO days 205   6 tablet   0   . HYDROcodone-acetaminophen (NORCO/VICODIN) 5-325 MG per tablet   Oral   Take 1-2 tablets by mouth every 6 (six) hours as needed for severe pain.   10 tablet   0   . ondansetron (ZOFRAN ODT) 4 MG disintegrating tablet   Oral   Take 1 tablet (4 mg total) by mouth every 8 (eight) hours as needed for nausea or vomiting.   10 tablet   0    BP 122/65  Pulse 74  Temp(Src) 98 F (36.7 C) (Oral)  Resp 16  SpO2 97%  LMP 05/18/2013 Physical Exam  Nursing note and vitals reviewed. Constitutional: She is oriented to person, place, and time. She appears well-developed and well-nourished. No distress.  HENT:  Head: Normocephalic and atraumatic.  Eyes: Conjunctivae are normal.  Neck: Normal range of motion. Neck supple. No JVD present. No tracheal deviation present. No thyromegaly present.  Cardiovascular: Normal rate, regular rhythm, normal heart sounds and intact distal pulses.   Pulmonary/Chest: Effort normal and breath sounds normal. No respiratory distress. She has no wheezes.  Abdominal: Soft. Normal appearance and bowel sounds are normal. There is no hepatosplenomegaly. There is tenderness in the right upper quadrant. There is no rigidity and no rebound.  Musculoskeletal:  Normal range of motion.  Lymphadenopathy:    She has no cervical adenopathy.  Neurological: She is alert and oriented to person, place, and time.  Skin: Skin is warm and dry.  Psychiatric: She has a normal mood and affect. Her behavior is normal. Judgment and thought content normal.    ED Course  Procedures (including critical care time) Labs Review Labs Reviewed  COMPREHENSIVE METABOLIC PANEL - Abnormal; Notable for the following:    Albumin 3.3 (*)    All other components within normal limits  CBC WITH DIFFERENTIAL - Abnormal; Notable for the following:    Hemoglobin 11.9 (*)    MCH 25.2 (*)    RDW 15.6 (*)    All other components within normal limits  URINALYSIS, ROUTINE W REFLEX MICROSCOPIC  LIPASE, BLOOD  POCT PREGNANCY, URINE   Imaging Review No results found.  EKG Interpretation   None       MDM   1. CAP (community acquired pneumonia)   2. RUQ abdominal pain   3. Vomiting and diarrhea    Awaiting CT results. Report to [employer], PA-C to dispo.     Elisha Headland, NP 06/06/13 2229

## 2013-05-29 NOTE — ED Notes (Signed)
Pt reports right side lower abd pain since wed night, having nausea diarrhea, no vomiting.

## 2013-05-29 NOTE — Discharge Instructions (Signed)
Please follow up with your primary care physician in 1-2 days. If you do not have one please call the [facility] number listed above. Please take pain medication as prescribed and as needed for pain. Please do not drive on narcotic pain medication. Please take your antibiotic until completion. Please read all discharge instructions and return precautions.   Pneumonia, Adult Pneumonia is an infection of the lungs.  CAUSES Pneumonia may be caused by bacteria or a virus. Usually, these infections are caused by breathing infectious particles into the lungs (respiratory tract). SYMPTOMS   Cough.  Fever.  Chest pain.  Increased rate of breathing.  Wheezing.  Mucus production. DIAGNOSIS  If you have the common symptoms of pneumonia, your caregiver will typically confirm the diagnosis with a chest X-ray. The X-ray will show an abnormality in the lung (pulmonary infiltrate) if you have pneumonia. Other tests of your blood, urine, or sputum may be done to find the specific cause of your pneumonia. Your caregiver may also do tests (blood gases or pulse oximetry) to see how well your lungs are working. TREATMENT  Some forms of pneumonia may be spread to other people when you cough or sneeze. You may be asked to wear a mask before and during your exam. Pneumonia that is caused by bacteria is treated with antibiotic medicine. Pneumonia that is caused by the influenza virus may be treated with an antiviral medicine. Most other viral infections must run their course. These infections will not respond to antibiotics.  PREVENTION A pneumococcal shot (vaccine) is available to prevent a common bacterial cause of pneumonia. This is usually suggested for:  People over 53 years old.  Patients on chemotherapy.  People with chronic lung problems, such as bronchitis or emphysema.  People with immune system problems. If you are over 65 or have a high risk condition, you may receive the  pneumococcal vaccine if you have not received it before. In some countries, a routine influenza vaccine is also recommended. This vaccine can help prevent some cases of pneumonia.You may be offered the influenza vaccine as part of your care. If you smoke, it is time to quit. You may receive instructions on how to stop smoking. Your caregiver can provide medicines and counseling to help you quit. HOME CARE INSTRUCTIONS   Cough suppressants may be used if you are losing too much rest. However, coughing protects you by clearing your lungs. You should avoid using cough suppressants if you can.  Your caregiver may have prescribed medicine if he or she thinks your pneumonia is caused by a bacteria or influenza. Finish your medicine even if you start to feel better.  Your caregiver may also prescribe an expectorant. This loosens the mucus to be coughed up.  Only take over-the-counter or prescription medicines for pain, discomfort, or fever as directed by your caregiver.  Do not smoke. Smoking is a common cause of bronchitis and can contribute to pneumonia. If you are a smoker and continue to smoke, your cough may last several weeks after your pneumonia has cleared.  A cold steam vaporizer or humidifier in your room or home may help loosen mucus.  Coughing is often worse at night. Sleeping in a semi-upright position in a recliner or using a couple pillows under your head will help with this.  Get rest as you feel it is needed. Your body will usually let you know when you need to rest. SEEK IMMEDIATE MEDICAL CARE IF:   Your illness becomes  worse. This is especially true if you are elderly or weakened from any other disease.  You cannot control your cough with suppressants and are losing sleep.  You begin coughing up blood.  You develop pain which is getting worse or is uncontrolled with medicines.  You have a fever.  Any of the symptoms which initially brought you in for treatment are getting  worse rather than better.  You develop shortness of breath or chest pain. MAKE SURE YOU:   Understand these instructions.  Will watch your condition.  Will get help right away if you are not doing well or get worse. Document Released: 04/08/2005 Document Revised: 07/01/2011 Document Reviewed: 06/28/2010 [facility] Patient Information 2014 [place], [place].    Abdominal Pain, Women Abdominal (stomach, pelvic, or belly) pain can be caused by many things. It is important to tell your doctor:  The location of the pain.  Does it come and go or is it present all the time?  Are there things that start the pain (eating certain foods, exercise)?  Are there other symptoms associated with the pain (fever, nausea, vomiting, diarrhea)? All of this is helpful to know when trying to find the cause of the pain. CAUSES   Stomach: virus or bacteria infection, or ulcer.  Intestine: appendicitis (inflamed appendix), regional ileitis (Crohn's disease), ulcerative colitis (inflamed colon), irritable bowel syndrome, diverticulitis (inflamed diverticulum of the colon), or cancer of the stomach or intestine.  Gallbladder disease or stones in the gallbladder.  Kidney disease, kidney stones, or infection.  Pancreas infection or cancer.  Fibromyalgia (pain disorder).  Diseases of the female organs:  Uterus: fibroid (non-cancerous) tumors or infection.  Fallopian tubes: infection or tubal pregnancy.  Ovary: cysts or tumors.  Pelvic adhesions (scar tissue).  Endometriosis (uterus lining tissue growing in the pelvis and on the pelvic organs).  Pelvic congestion syndrome (female organs filling up with blood just before the menstrual period).  Pain with the menstrual period.  Pain with ovulation (producing an egg).  Pain with an IUD (intrauterine device, birth control) in the uterus.  Cancer of the female organs.  Functional pain (pain not caused by a disease, may improve without  treatment).  Psychological pain.  Depression. DIAGNOSIS  Your doctor will decide the seriousness of your pain by doing an examination.  Blood tests.  X-rays.  Ultrasound.  CT scan (computed tomography, special type of X-ray).  MRI (magnetic resonance imaging).  Cultures, for infection.  Barium enema (dye inserted in the large intestine, to better view it with X-rays).  Colonoscopy (looking in intestine with a lighted tube).  Laparoscopy (minor surgery, looking in abdomen with a lighted tube).  Major abdominal exploratory surgery (looking in abdomen with a large incision). TREATMENT  The treatment will depend on the cause of the pain.   Many cases can be observed and treated at home.  Over-the-counter medicines recommended by your caregiver.  Prescription medicine.  Antibiotics, for infection.  Birth control pills, for painful periods or for ovulation pain.  Hormone treatment, for endometriosis.  Nerve blocking injections.  Physical therapy.  Antidepressants.  Counseling with a psychologist or psychiatrist.  Minor or major surgery. HOME CARE INSTRUCTIONS   Do not take laxatives, unless directed by your caregiver.  Take over-the-counter pain medicine only if ordered by your caregiver. Do not take aspirin because it can cause an upset stomach or bleeding.  Try a clear liquid diet (broth or water) as ordered by your caregiver. Slowly move to a bland diet, as tolerated,  if the pain is related to the stomach or intestine.  Have a thermometer and take your temperature several times a day, and record it.  Bed rest and sleep, if it helps the pain.  Avoid sexual intercourse, if it causes pain.  Avoid stressful situations.  Keep your follow-up appointments and tests, as your caregiver orders.  If the pain does not go away with medicine or surgery, you may try:  Acupuncture.  Relaxation exercises (yoga, meditation).  Group therapy.  Counseling. SEEK  MEDICAL CARE IF:   You notice certain foods cause stomach pain.  Your home care treatment is not helping your pain.  You need stronger pain medicine.  You want your IUD removed.  You feel faint or lightheaded.  You develop nausea and vomiting.  You develop a rash.  You are having side effects or an allergy to your medicine. SEEK IMMEDIATE MEDICAL CARE IF:   Your pain does not go away or gets worse.  You have a fever.  Your pain is felt only in portions of the abdomen. The right side could possibly be appendicitis. The left lower portion of the abdomen could be colitis or diverticulitis.  You are passing blood in your stools (bright red or black tarry stools, with or without vomiting).  You have blood in your urine.  You develop chills, with or without a fever.  You pass out. MAKE SURE YOU:   Understand these instructions.  Will watch your condition.  Will get help right away if you are not doing well or get worse. Document Released: 02/03/2007 Document Revised: 07/01/2011 Document Reviewed: 02/23/2009 [facility] Patient Information 2014 [place], [place].

## 2013-05-29 NOTE — ED Notes (Signed)
Warm compress applied to right extremity.

## 2013-05-29 NOTE — ED Notes (Signed)
PA notified of increased pain.

## 2013-05-30 NOTE — ED Provider Notes (Signed)
Medical screening examination/treatment/procedure(s) were performed by non-physician practitioner and as supervising physician I was immediately available for consultation/collaboration.  EKG Interpretation   None         Elmer Sow, MD 05/30/13 [id number]

## 2013-06-02 ENCOUNTER — Other Ambulatory Visit: Payer: Self-pay | Admitting: Family Medicine

## 2013-06-02 ENCOUNTER — Ambulatory Visit
Admission: RE | Admit: 2013-06-02 | Discharge: 2013-06-02 | Disposition: A | Discharge status: Home / Self Care | Payer: Managed Care, Other (non HMO) | Source: Ambulatory Visit | Attending: Family Medicine | Admitting: Family Medicine

## 2013-06-02 DIAGNOSIS — J189 Pneumonia, unspecified organism: Secondary | ICD-10-CM

## 2013-06-07 NOTE — ED Provider Notes (Signed)
Medical screening examination/treatment/procedure(s) were performed by non-physician practitioner and as supervising physician I was immediately available for consultation/collaboration.  Richarda Blade, MD 06/07/13 [id number]

## 2014-04-02 ENCOUNTER — Encounter (HOSPITAL_COMMUNITY): Payer: Self-pay | Admitting: *Deleted

## 2014-04-02 ENCOUNTER — Emergency Department (HOSPITAL_COMMUNITY): Payer: Managed Care, Other (non HMO)

## 2014-04-02 ENCOUNTER — Emergency Department (HOSPITAL_COMMUNITY)
Admission: EM | Admit: 2014-04-02 | Discharge: 2014-04-02 | Disposition: A | Discharge status: Home / Self Care | Payer: Managed Care, Other (non HMO) | Attending: Emergency Medicine | Admitting: Emergency Medicine

## 2014-04-02 DIAGNOSIS — Z79899 Other long term (current) drug therapy: Secondary | ICD-10-CM | POA: Diagnosis not present

## 2014-04-02 DIAGNOSIS — S4991XA Unspecified injury of right shoulder and upper arm, initial encounter: Secondary | ICD-10-CM | POA: Insufficient documentation

## 2014-04-02 DIAGNOSIS — Y92330 Ice skating rink (indoor) (outdoor) as the place of occurrence of the external cause: Secondary | ICD-10-CM | POA: Insufficient documentation

## 2014-04-02 DIAGNOSIS — Y998 Other external cause status: Secondary | ICD-10-CM | POA: Diagnosis not present

## 2014-04-02 DIAGNOSIS — Y9321 Activity, ice skating: Secondary | ICD-10-CM | POA: Insufficient documentation

## 2014-04-02 DIAGNOSIS — W19XXXA Unspecified fall, initial encounter: Secondary | ICD-10-CM

## 2014-04-02 DIAGNOSIS — M25511 Pain in right shoulder: Secondary | ICD-10-CM

## 2014-04-02 DIAGNOSIS — E079 Disorder of thyroid, unspecified: Secondary | ICD-10-CM | POA: Diagnosis not present

## 2014-04-02 DIAGNOSIS — Z792 Long term (current) use of antibiotics: Secondary | ICD-10-CM | POA: Insufficient documentation

## 2014-04-02 MED ORDER — KETOROLAC TROMETHAMINE 60 MG/2ML IM SOLN
60.0000 mg | Freq: Once | INTRAMUSCULAR | Status: AC
  Administered 2014-04-02: 60 mg via INTRAMUSCULAR
  Filled 2014-04-02: qty 2

## 2014-04-02 NOTE — Discharge Instructions (Signed)
Return to the emergency room with worsening of symptoms, new symptoms or with symptoms that are concerning, numbness, tingling, weakness, swelling, redness, significant pain or difficulty moving extremity. RICE: Rest, Ice (three cycles of 20 mins on, 21mins off at least twice a day), compression/brace, elevation. Heating pad works well for back pain. Ibuprofen 400mg  (2 tablets 200mg ) every 5-6 hours for 3-5 days and then as needed for pain. Follow up with PCP if symptoms worsen or are persistent.   Arthralgia Your caregiver has diagnosed you as suffering from an arthralgia. Arthralgia means there is pain in a joint. This can come from many reasons including:  Bruising the joint which causes soreness (inflammation) in the joint.  Wear and tear on the joints which occur as we grow older (osteoarthritis).  Overusing the joint.  Various forms of arthritis.  Infections of the joint. Regardless of the cause of pain in your joint, most of these different pains respond to anti-inflammatory drugs and rest. The exception to this is when a joint is infected, and these cases are treated with antibiotics, if it is a bacterial infection. HOME CARE INSTRUCTIONS   Rest the injured area for as long as directed by your caregiver. Then slowly start using the joint as directed by your caregiver and as the pain allows. Crutches as directed may be useful if the ankles, knees or hips are involved. If the knee was splinted or casted, continue use and care as directed. If an stretchy or elastic wrapping bandage has been applied today, it should be removed and re-applied every 3 to 4 hours. It should not be applied tightly, but firmly enough to keep swelling down. Watch toes and feet for swelling, bluish discoloration, coldness, numbness or excessive pain. If any of these problems (symptoms) occur, remove the ace bandage and re-apply more loosely. If these symptoms persist, contact your caregiver or return to this  location.  For the first 24 hours, keep the injured extremity elevated on pillows while lying down.  Apply ice for 15-20 minutes to the sore joint every couple hours while awake for the first half day. Then 03-04 times per day for the first 48 hours. Put the ice in a plastic bag and place a towel between the bag of ice and your skin.  Wear any splinting, casting, elastic bandage applications, or slings as instructed.  Only take over-the-counter or prescription medicines for pain, discomfort, or fever as directed by your caregiver. Do not use aspirin immediately after the injury unless instructed by your physician. Aspirin can cause increased bleeding and bruising of the tissues.  If you were given crutches, continue to use them as instructed and do not resume weight bearing on the sore joint until instructed. Persistent pain and inability to use the sore joint as directed for more than 2 to 3 days are warning signs indicating that you should see a caregiver for a follow-up visit as soon as possible. Initially, a hairline fracture (break in bone) may not be evident on X-rays. Persistent pain and swelling indicate that further evaluation, non-weight bearing or use of the joint (use of crutches or slings as instructed), or further X-rays are indicated. X-rays may sometimes not show a small fracture until a week or 10 days later. Make a follow-up appointment with your own caregiver or one to whom we have referred you. A radiologist (specialist in reading X-rays) may read your X-rays. Make sure you know how you are to obtain your X-ray results. Do not assume everything  is normal if you do not hear from [place]. SEEK MEDICAL CARE IF: Bruising, swelling, or pain increases. SEEK IMMEDIATE MEDICAL CARE IF:   Your fingers or toes are numb or blue.  The pain is not responding to medications and continues to stay the same or get worse.  The pain in your joint becomes severe.  You develop a fever over 102 F  (38.9 C).  It becomes impossible to move or use the joint. MAKE SURE YOU:   Understand these instructions.  Will watch your condition.  Will get help right away if you are not doing well or get worse. Document Released: 04/08/2005 Document Revised: 07/01/2011 Document Reviewed: 11/25/2007 [facility] Patient Information 2015 [place], [place]. This information is not intended to replace advice given to you by your health care provider. Make sure you discuss any questions you have with your health care provider.

## 2014-04-02 NOTE — ED Notes (Signed)
Patient transported to X-ray 

## 2014-04-02 NOTE — ED Notes (Signed)
Pt. Reports falling on ice today. Pt now has pain to Rt shoulder.

## 2014-04-02 NOTE — ED Provider Notes (Signed)
CSN: [id number]     Arrival date & time 04/02/14  0941 History  This chart was scribed for non-physician practitioner, Doyce Loose, PA-C working with Carmin Muskrat, MD, by Erling Conte, ED Scribe. This patient was seen in room TR06C/TR06C and the patient's care was started at 1:08 PM.    Chief Complaint  Patient presents with  . Shoulder Pain    The history is provided by the patient. No language interpreter was used.    HPI Comments: [name] is a 43 y.o. female who presents to the Emergency Department complaining of right shoulder pain that occurred about 1 hour ago. She states she was ice skating and she fell sideways and tried to grab the fall and jerked her right shoulder. Pt notes that she just hit her knees on the ice but no other part of her body. She denies any knee issues. No head injury or LOC from the fall. She is having some mild tingling in her fingers. No prior injuries to the right shoulder. Has not taken anything for the pain. She denies any chance of pregnancy. She denies any numbness, weakness, redness to the area, right shoulder swelling, fever, chills, nausea, or vomiting.   Past Medical History  Diagnosis Date  . Thyroid disease    Past Surgical History  Procedure Laterality Date  . Thyroidectomy     History reviewed. No pertinent family history. History  Substance Use Topics  . Smoking status: Never Smoker   . Smokeless tobacco: Not on file  . Alcohol Use: No   OB History    No data available     Review of Systems  Constitutional: Negative for fever.  Gastrointestinal: Negative for nausea and vomiting.  Musculoskeletal: Positive for arthralgias (R shoulder).  Skin: Negative for color change and wound.  Neurological: Negative for syncope, weakness, numbness and headaches.      Allergies  Review of patient's allergies indicates no known allergies.  Home Medications   Prior to Admission medications   Medication Sig Start Date End Date  Taking? Authorizing Provider  azithromycin (ZITHROMAX Z-PAK) 250 MG tablet Take 1 tablet (250 mg total) by mouth daily. 500mg  PO day 1, then 250mg  PO days 205 05/29/13   Jennifer L Piepenbrink, PA-C  HYDROcodone-acetaminophen (NORCO/VICODIN) 5-325 MG per tablet Take 1-2 tablets by mouth every 6 (six) hours as needed for severe pain. 05/29/13   Jennifer L Piepenbrink, PA-C  levothyroxine (SYNTHROID, LEVOTHROID) 200 MCG tablet Take 200 mcg by mouth daily before breakfast.    Historical Provider, MD  ondansetron (ZOFRAN ODT) 4 MG disintegrating tablet Take 1 tablet (4 mg total) by mouth every 8 (eight) hours as needed for nausea or vomiting. 05/29/13   Jennifer L Piepenbrink, PA-C  Vitamin D, Ergocalciferol, (DRISDOL) 50000 UNITS CAPS capsule Take 50,000 Units by mouth 2 (two) times a week. On Monday and wednesday    Historical Provider, MD   Triage Vitals: BP 149/85 mmHg  Pulse 81  Temp(Src) 98 F (36.7 C) (Oral)  Resp 18  SpO2 99%  LMP 03/12/2014  Physical Exam  Constitutional: She appears well-developed and well-nourished. No distress.  HENT:  Head: Normocephalic and atraumatic.  Eyes: Conjunctivae are normal. Right eye exhibits no discharge. Left eye exhibits no discharge.  Cardiovascular:  Pulses:      Radial pulses are 2+ on the right side, and 2+ on the left side.  < 3 cap refill   Pulmonary/Chest: Effort normal. No respiratory distress.  Musculoskeletal:  No wrist of  forearm tenderness Full ROM of shoulder No clavicular step off, crepitus or tenderness No noted deformity of right shoulder.  Anterior shoulder tenderness Strength and sensation intact in upper extremity. No tenderness or swelling in bilateral knees. Full ROM of knee. No lesions   Neurological: She is alert. Coordination normal.  Skin: She is not diaphoretic.  Psychiatric: She has a normal mood and affect. Her behavior is normal.  Nursing note and vitals reviewed.   ED Course  Procedures (including critical care  time)  DIAGNOSTIC STUDIES: Oxygen Saturation is 99% on RA, normal by my interpretation.    COORDINATION OF CARE:    Labs Review Labs Reviewed - No data to display  Imaging Review Dg Shoulder Right  04/02/2014   CLINICAL DATA:  Anterior right shoulder pain since falling today while ice skating and grabbing the rail as she fell; no prior history of injury or surgery  EXAM: RIGHT SHOULDER - 2+ VIEW  COMPARISON:  None.  FINDINGS: There is no evidence of fracture or dislocation. There is no evidence of arthropathy or other focal bone abnormality. Soft tissues are unremarkable.  IMPRESSION: Negative.   Electronically Signed   By: Lajean Manes M.D.   On: 04/02/2014 13:00     EKG Interpretation None      MDM   Final diagnoses:  Fall  Right shoulder pain   Patient X-Ray negative for obvious fracture or dislocation. Pain managed in ED. Pt without erythema, edema or warmth to joint. I doubt septic arthritis. Pt advised to follow up with PCP if symptoms persist. Patient given brace while in ED, RICE, ibuprofen and conservative therapy recommended and discussed.   Discussed return precautions with patient. Discussed all results and patient verbalizes understanding and agrees with plan.  I personally performed the services described in this documentation, which was scribed in my presence. The recorded information has been reviewed and is accurate.     Pura Spice, PA-C 04/02/14 [place], MD 04/02/14 [phone]

## 2016-03-18 ENCOUNTER — Other Ambulatory Visit: Payer: Self-pay | Admitting: Family Medicine

## 2016-03-18 ENCOUNTER — Ambulatory Visit
Admission: RE | Admit: 2016-03-18 | Discharge: 2016-03-18 | Disposition: A | Discharge status: Home / Self Care | Payer: Managed Care, Other (non HMO) | Source: Ambulatory Visit | Attending: Family Medicine | Admitting: Family Medicine

## 2016-03-18 DIAGNOSIS — M25562 Pain in left knee: Secondary | ICD-10-CM

## 2016-04-23 ENCOUNTER — Other Ambulatory Visit: Payer: Self-pay | Admitting: Family Medicine

## 2016-04-23 DIAGNOSIS — M2392 Unspecified internal derangement of left knee: Secondary | ICD-10-CM

## 2016-04-30 ENCOUNTER — Ambulatory Visit
Admission: RE | Admit: 2016-04-30 | Discharge: 2016-04-30 | Disposition: A | Discharge status: Home / Self Care | Payer: Managed Care, Other (non HMO) | Source: Ambulatory Visit | Attending: Family Medicine | Admitting: Family Medicine

## 2016-04-30 DIAGNOSIS — M2392 Unspecified internal derangement of left knee: Secondary | ICD-10-CM

## 2017-05-08 ENCOUNTER — Emergency Department (HOSPITAL_COMMUNITY): Payer: Managed Care, Other (non HMO)

## 2017-05-08 ENCOUNTER — Encounter (HOSPITAL_COMMUNITY): Payer: Self-pay | Admitting: Emergency Medicine

## 2017-05-08 ENCOUNTER — Emergency Department (HOSPITAL_COMMUNITY)
Admission: EM | Admit: 2017-05-08 | Discharge: 2017-05-08 | Disposition: A | Discharge status: Home / Self Care | Payer: Managed Care, Other (non HMO) | Attending: Emergency Medicine | Admitting: Emergency Medicine

## 2017-05-08 DIAGNOSIS — Y929 Unspecified place or not applicable: Secondary | ICD-10-CM | POA: Insufficient documentation

## 2017-05-08 DIAGNOSIS — E079 Disorder of thyroid, unspecified: Secondary | ICD-10-CM | POA: Insufficient documentation

## 2017-05-08 DIAGNOSIS — S20312A Abrasion of left front wall of thorax, initial encounter: Secondary | ICD-10-CM | POA: Insufficient documentation

## 2017-05-08 DIAGNOSIS — Z79899 Other long term (current) drug therapy: Secondary | ICD-10-CM | POA: Diagnosis not present

## 2017-05-08 DIAGNOSIS — Y999 Unspecified external cause status: Secondary | ICD-10-CM | POA: Diagnosis not present

## 2017-05-08 DIAGNOSIS — R0789 Other chest pain: Secondary | ICD-10-CM

## 2017-05-08 DIAGNOSIS — Y9389 Activity, other specified: Secondary | ICD-10-CM | POA: Diagnosis not present

## 2017-05-08 MED ORDER — NAPROXEN 500 MG PO TABS: 500.0000 mg | ORAL_TABLET | Freq: Two times a day (BID) | ORAL | 0 refills | Status: DC | PRN

## 2017-05-08 MED ORDER — NAPROXEN 500 MG PO TABS
500.0000 mg | ORAL_TABLET | Freq: Once | ORAL | Status: AC
  Administered 2017-05-08: 500 mg via ORAL
  Filled 2017-05-08: qty 1

## 2017-05-08 MED ORDER — CYCLOBENZAPRINE HCL 10 MG PO TABS: 10.0000 mg | ORAL_TABLET | Freq: Three times a day (TID) | ORAL | 0 refills | Status: DC | PRN

## 2017-05-08 NOTE — Discharge Instructions (Signed)
Take naprosyn as directed for inflammation and pain with tylenol for breakthrough pain and flexeril for muscle relaxation. Do not drive or operate machinery with muscle relaxant use. Ice to areas of soreness for the next 24 hours and then may move to heat, no more than 20 minutes at a time every hour for each. Apply aloe vera or sunburn cream to the red mark on your chest. Expect to be sore for the next few days and follow up with primary care physician for recheck of ongoing symptoms in the next 1-2 weeks. Return to ER for emergent changing or worsening of symptoms.
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
## 2017-12-15 ENCOUNTER — Other Ambulatory Visit: Payer: Self-pay | Admitting: Family Medicine

## 2017-12-15 ENCOUNTER — Other Ambulatory Visit (HOSPITAL_COMMUNITY)
Admission: RE | Admit: 2017-12-15 | Discharge: 2017-12-15 | Disposition: A | Discharge status: Home / Self Care | Payer: Managed Care, Other (non HMO) | Source: Ambulatory Visit | Attending: Family Medicine | Admitting: Family Medicine

## 2017-12-15 DIAGNOSIS — Z124 Encounter for screening for malignant neoplasm of cervix: Secondary | ICD-10-CM | POA: Diagnosis not present

## 2017-12-16 ENCOUNTER — Other Ambulatory Visit: Payer: Self-pay | Admitting: Family Medicine

## 2017-12-16 DIAGNOSIS — N920 Excessive and frequent menstruation with regular cycle: Secondary | ICD-10-CM

## 2017-12-18 ENCOUNTER — Ambulatory Visit
Admission: RE | Admit: 2017-12-18 | Discharge: 2017-12-18 | Disposition: A | Discharge status: Home / Self Care | Payer: Managed Care, Other (non HMO) | Source: Ambulatory Visit | Attending: Family Medicine | Admitting: Family Medicine

## 2017-12-18 DIAGNOSIS — N920 Excessive and frequent menstruation with regular cycle: Secondary | ICD-10-CM

## 2017-12-18 LAB — CYTOLOGY - PAP
Diagnosis: UNDETERMINED — AB
HPV: NOT DETECTED

## 2018-01-29 ENCOUNTER — Other Ambulatory Visit: Payer: Self-pay | Admitting: Obstetrics & Gynecology
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
## 2018-03-09 ENCOUNTER — Encounter (HOSPITAL_BASED_OUTPATIENT_CLINIC_OR_DEPARTMENT_OTHER): Payer: Self-pay | Admitting: *Deleted

## 2018-03-09 ENCOUNTER — Other Ambulatory Visit: Payer: Self-pay

## 2018-03-16 ENCOUNTER — Encounter (HOSPITAL_BASED_OUTPATIENT_CLINIC_OR_DEPARTMENT_OTHER)
Admission: RE | Admit: 2018-03-16 | Discharge: 2018-03-16 | Disposition: A | Discharge status: Home / Self Care | Payer: Managed Care, Other (non HMO) | Source: Ambulatory Visit | Attending: Obstetrics & Gynecology | Admitting: Obstetrics & Gynecology

## 2018-03-16 DIAGNOSIS — H5461 Unqualified visual loss, right eye, normal vision left eye: Secondary | ICD-10-CM | POA: Diagnosis not present

## 2018-03-16 DIAGNOSIS — K589 Irritable bowel syndrome without diarrhea: Secondary | ICD-10-CM | POA: Diagnosis not present

## 2018-03-16 DIAGNOSIS — Z79899 Other long term (current) drug therapy: Secondary | ICD-10-CM | POA: Diagnosis not present

## 2018-03-16 DIAGNOSIS — I8390 Asymptomatic varicose veins of unspecified lower extremity: Secondary | ICD-10-CM | POA: Diagnosis not present

## 2018-03-16 DIAGNOSIS — Z6841 Body Mass Index (BMI) 40.0 and over, adult: Secondary | ICD-10-CM | POA: Diagnosis not present

## 2018-03-16 DIAGNOSIS — Z01818 Encounter for other preprocedural examination: Secondary | ICD-10-CM

## 2018-03-16 DIAGNOSIS — Z8632 Personal history of gestational diabetes: Secondary | ICD-10-CM | POA: Diagnosis not present

## 2018-03-16 DIAGNOSIS — Z803 Family history of malignant neoplasm of breast: Secondary | ICD-10-CM | POA: Diagnosis not present

## 2018-03-16 DIAGNOSIS — Z833 Family history of diabetes mellitus: Secondary | ICD-10-CM | POA: Diagnosis not present

## 2018-03-16 DIAGNOSIS — Z8249 Family history of ischemic heart disease and other diseases of the circulatory system: Secondary | ICD-10-CM | POA: Diagnosis not present

## 2018-03-16 DIAGNOSIS — E7800 Pure hypercholesterolemia, unspecified: Secondary | ICD-10-CM | POA: Diagnosis not present

## 2018-03-16 DIAGNOSIS — N938 Other specified abnormal uterine and vaginal bleeding: Secondary | ICD-10-CM | POA: Diagnosis not present

## 2018-03-16 DIAGNOSIS — E559 Vitamin D deficiency, unspecified: Secondary | ICD-10-CM | POA: Diagnosis not present

## 2018-03-16 DIAGNOSIS — I10 Essential (primary) hypertension: Secondary | ICD-10-CM | POA: Diagnosis not present

## 2018-03-16 DIAGNOSIS — E039 Hypothyroidism, unspecified: Secondary | ICD-10-CM | POA: Diagnosis not present

## 2018-03-16 LAB — BASIC METABOLIC PANEL
ANION GAP: 6 (ref 5–15)
BUN: 10 mg/dL (ref 6–20)
CALCIUM: 8.6 mg/dL — AB (ref 8.9–10.3)
CO2: 24 mmol/L (ref 22–32)
Chloride: 108 mmol/L (ref 98–111)
Creatinine, Ser: 0.74 mg/dL (ref 0.44–1.00)
GFR calc Af Amer: >60 mL/min (ref >60)
GLUCOSE: 98 mg/dL (ref 70–99)
POTASSIUM: 4 mmol/L (ref 3.5–5.1)
SODIUM: 138 mmol/L (ref 135–145)

## 2018-03-16 LAB — CBC
HCT: 37.9 % (ref 36.0–46.0)
HEMOGLOBIN: 11.3 g/dL — AB (ref 12.0–15.0)
MCH: 22.8 pg — AB (ref 26.0–34.0)
MCHC: 29.8 g/dL — AB (ref 30.0–36.0)
MCV: 76.6 fL — ABNORMAL LOW (ref 80.0–100.0)
PLATELETS: 324 10*3/uL (ref 150–400)
RBC: 4.95 MIL/uL (ref 3.87–5.11)
RDW: 16.2 % — ABNORMAL HIGH (ref 11.5–15.5)
WBC: 7.8 10*3/uL (ref 4.0–10.5)
nRBC: 0 % (ref 0.0–0.2)

## 2018-03-16 LAB — POCT PREGNANCY, URINE: Preg Test, Ur: NEGATIVE

## 2018-03-18 ENCOUNTER — Encounter (HOSPITAL_BASED_OUTPATIENT_CLINIC_OR_DEPARTMENT_OTHER)
Admission: RE | Disposition: A | Discharge status: Home / Self Care | Payer: Self-pay | Source: Ambulatory Visit | Attending: Obstetrics & Gynecology

## 2018-03-18 ENCOUNTER — Ambulatory Visit (HOSPITAL_BASED_OUTPATIENT_CLINIC_OR_DEPARTMENT_OTHER)
Admission: RE | Admit: 2018-03-18 | Discharge: 2018-03-18 | Disposition: A | Discharge status: Home / Self Care | Payer: Managed Care, Other (non HMO) | Source: Ambulatory Visit | Attending: Obstetrics & Gynecology | Admitting: Obstetrics & Gynecology

## 2018-03-18 ENCOUNTER — Ambulatory Visit (HOSPITAL_BASED_OUTPATIENT_CLINIC_OR_DEPARTMENT_OTHER): Payer: Managed Care, Other (non HMO) | Admitting: Anesthesiology

## 2018-03-18 ENCOUNTER — Encounter (HOSPITAL_BASED_OUTPATIENT_CLINIC_OR_DEPARTMENT_OTHER): Payer: Self-pay | Admitting: Anesthesiology

## 2018-03-18 ENCOUNTER — Other Ambulatory Visit: Payer: Self-pay

## 2018-03-18 DIAGNOSIS — E559 Vitamin D deficiency, unspecified: Secondary | ICD-10-CM | POA: Insufficient documentation

## 2018-03-18 DIAGNOSIS — I10 Essential (primary) hypertension: Secondary | ICD-10-CM | POA: Insufficient documentation

## 2018-03-18 DIAGNOSIS — K589 Irritable bowel syndrome without diarrhea: Secondary | ICD-10-CM | POA: Insufficient documentation

## 2018-03-18 DIAGNOSIS — Z803 Family history of malignant neoplasm of breast: Secondary | ICD-10-CM | POA: Insufficient documentation

## 2018-03-18 DIAGNOSIS — Z833 Family history of diabetes mellitus: Secondary | ICD-10-CM | POA: Insufficient documentation

## 2018-03-18 DIAGNOSIS — E7800 Pure hypercholesterolemia, unspecified: Secondary | ICD-10-CM | POA: Insufficient documentation

## 2018-03-18 DIAGNOSIS — I8390 Asymptomatic varicose veins of unspecified lower extremity: Secondary | ICD-10-CM | POA: Insufficient documentation

## 2018-03-18 DIAGNOSIS — Z6841 Body Mass Index (BMI) 40.0 and over, adult: Secondary | ICD-10-CM | POA: Insufficient documentation

## 2018-03-18 DIAGNOSIS — Z79899 Other long term (current) drug therapy: Secondary | ICD-10-CM | POA: Insufficient documentation

## 2018-03-18 DIAGNOSIS — E039 Hypothyroidism, unspecified: Secondary | ICD-10-CM | POA: Insufficient documentation

## 2018-03-18 DIAGNOSIS — N939 Abnormal uterine and vaginal bleeding, unspecified: Secondary | ICD-10-CM

## 2018-03-18 DIAGNOSIS — N938 Other specified abnormal uterine and vaginal bleeding: Secondary | ICD-10-CM | POA: Insufficient documentation

## 2018-03-18 DIAGNOSIS — Z8632 Personal history of gestational diabetes: Secondary | ICD-10-CM | POA: Insufficient documentation

## 2018-03-18 DIAGNOSIS — Z8249 Family history of ischemic heart disease and other diseases of the circulatory system: Secondary | ICD-10-CM | POA: Insufficient documentation

## 2018-03-18 DIAGNOSIS — H5461 Unqualified visual loss, right eye, normal vision left eye: Secondary | ICD-10-CM | POA: Insufficient documentation

## 2018-03-18 HISTORY — DX: Essential (primary) hypertension: I10

## 2018-03-18 HISTORY — DX: Hypothyroidism, unspecified: E03.9

## 2018-03-18 HISTORY — PX: DILITATION & CURRETTAGE/HYSTROSCOPY WITH HYDROTHERMAL ABLATION: SHX5570

## 2018-03-18 SURGERY — DILATATION & CURETTAGE/HYSTEROSCOPY WITH HYDROTHERMAL ABLATION
Anesthesia: General | Site: Uterus

## 2018-03-18 MED ORDER — HYDROCODONE-ACETAMINOPHEN 7.5-325 MG PO TABS: 1.0000 | ORAL_TABLET | Freq: Once | ORAL | Status: DC | PRN

## 2018-03-18 MED ORDER — FENTANYL CITRATE (PF) 100 MCG/2ML IJ SOLN
INTRAMUSCULAR | Status: AC
  Filled 2018-03-18: qty 2

## 2018-03-18 MED ORDER — ONDANSETRON HCL 4 MG/2ML IJ SOLN
INTRAMUSCULAR | Status: AC
  Filled 2018-03-18: qty 2

## 2018-03-18 MED ORDER — METOCLOPRAMIDE HCL 5 MG/ML IJ SOLN
10.0000 mg | Freq: Once | INTRAMUSCULAR | Status: AC | PRN
  Administered 2018-03-18: 10 mg via INTRAVENOUS

## 2018-03-18 MED ORDER — ONDANSETRON HCL 4 MG/2ML IJ SOLN
INTRAMUSCULAR | Status: DC | PRN
  Administered 2018-03-18: 4 mg via INTRAVENOUS

## 2018-03-18 MED ORDER — MIDAZOLAM HCL 2 MG/2ML IJ SOLN
INTRAMUSCULAR | Status: AC
  Filled 2018-03-18: qty 2

## 2018-03-18 MED ORDER — LACTATED RINGERS IV SOLN
INTRAVENOUS | Status: DC
  Administered 2018-03-18 (×2): via INTRAVENOUS

## 2018-03-18 MED ORDER — MIDAZOLAM HCL 2 MG/2ML IJ SOLN
1.0000 mg | INTRAMUSCULAR | Status: DC | PRN
  Administered 2018-03-18: 2 mg via INTRAVENOUS

## 2018-03-18 MED ORDER — DEXAMETHASONE SODIUM PHOSPHATE 4 MG/ML IJ SOLN
INTRAMUSCULAR | Status: DC | PRN
  Administered 2018-03-18: 10 mg via INTRAVENOUS

## 2018-03-18 MED ORDER — KETOROLAC TROMETHAMINE 30 MG/ML IJ SOLN
INTRAMUSCULAR | Status: DC | PRN
  Administered 2018-03-18: 30 mg via INTRAVENOUS

## 2018-03-18 MED ORDER — MEPERIDINE HCL 25 MG/ML IJ SOLN: 6.2500 mg | INTRAMUSCULAR | Status: DC | PRN

## 2018-03-18 MED ORDER — FENTANYL CITRATE (PF) 100 MCG/2ML IJ SOLN
25.0000 ug | INTRAMUSCULAR | Status: DC | PRN
  Administered 2018-03-18: 25 ug via INTRAVENOUS
  Administered 2018-03-18: 50 ug via INTRAVENOUS

## 2018-03-18 MED ORDER — SUCCINYLCHOLINE CHLORIDE 200 MG/10ML IV SOSY
PREFILLED_SYRINGE | INTRAVENOUS | Status: AC
  Filled 2018-03-18: qty 10

## 2018-03-18 MED ORDER — LACTATED RINGERS IV SOLN
INTRAVENOUS | Status: DC
  Administered 2018-03-18: 10:00:00 via INTRAVENOUS

## 2018-03-18 MED ORDER — EPHEDRINE 5 MG/ML INJ
INTRAVENOUS | Status: AC
  Filled 2018-03-18: qty 10

## 2018-03-18 MED ORDER — LIDOCAINE HCL (CARDIAC) PF 100 MG/5ML IV SOSY
PREFILLED_SYRINGE | INTRAVENOUS | Status: DC | PRN
  Administered 2018-03-18: 100 mg via INTRAVENOUS

## 2018-03-18 MED ORDER — KETOROLAC TROMETHAMINE 30 MG/ML IJ SOLN
INTRAMUSCULAR | Status: AC
  Filled 2018-03-18: qty 1

## 2018-03-18 MED ORDER — SCOPOLAMINE 1 MG/3DAYS TD PT72: 1.0000 | MEDICATED_PATCH | Freq: Once | TRANSDERMAL | Status: DC | PRN

## 2018-03-18 MED ORDER — PROPOFOL 10 MG/ML IV BOLUS
INTRAVENOUS | Status: DC | PRN
  Administered 2018-03-18: 150 mg via INTRAVENOUS

## 2018-03-18 MED ORDER — LIDOCAINE 2% (20 MG/ML) 5 ML SYRINGE
INTRAMUSCULAR | Status: AC
  Filled 2018-03-18: qty 5

## 2018-03-18 MED ORDER — LIDOCAINE-EPINEPHRINE 1 %-1:100000 IJ SOLN
INTRAMUSCULAR | Status: DC | PRN
  Administered 2018-03-18: 20 mL

## 2018-03-18 MED ORDER — METOCLOPRAMIDE HCL 5 MG/ML IJ SOLN
INTRAMUSCULAR | Status: AC
  Filled 2018-03-18: qty 2

## 2018-03-18 MED ORDER — FENTANYL CITRATE (PF) 100 MCG/2ML IJ SOLN
50.0000 ug | INTRAMUSCULAR | Status: DC | PRN
  Administered 2018-03-18: 100 ug via INTRAVENOUS

## 2018-03-18 MED ORDER — DEXAMETHASONE SODIUM PHOSPHATE 10 MG/ML IJ SOLN
INTRAMUSCULAR | Status: AC
  Filled 2018-03-18: qty 1

## 2018-03-18 SURGICAL SUPPLY — 19 items
BRIEF STRETCH FOR OB PAD XXL (UNDERPADS AND DIAPERS) ×2 IMPLANT
CANISTER SUCT 3000ML PPV (MISCELLANEOUS) ×1 IMPLANT
CATH ROBINSON RED A/P 16FR (CATHETERS) ×1 IMPLANT
CLOTH BEACON ORANGE TIMEOUT ST (SAFETY) ×1 IMPLANT
CONTAINER PREFILL 10% NBF 60ML (FORM) ×3 IMPLANT
DILATOR CANAL MILEX (MISCELLANEOUS) IMPLANT
GLOVE BIO SURGEON STRL SZ7 (GLOVE) ×1 IMPLANT
GLOVE BIOGEL PI IND STRL 6.5 (GLOVE) ×1 IMPLANT
GLOVE BIOGEL PI IND STRL 7.0 (GLOVE) ×1 IMPLANT
GLOVE BIOGEL PI IND STRL 7.5 (GLOVE) IMPLANT
GLOVE BIOGEL PI INDICATOR 6.5 (GLOVE) ×1
GLOVE BIOGEL PI INDICATOR 7.0 (GLOVE) ×1
GLOVE BIOGEL PI INDICATOR 7.5 (GLOVE) ×1
GLOVE ECLIPSE 6.5 STRL STRAW (GLOVE) ×2 IMPLANT
GOWN STRL REUS W/TWL LRG LVL3 (GOWN DISPOSABLE) ×4 IMPLANT
PACK VAGINAL MINOR WOMEN LF (CUSTOM PROCEDURE TRAY) ×2 IMPLANT
PAD OB MATERNITY 4.3X12.25 (PERSONAL CARE ITEMS) ×2 IMPLANT
SET GENESYS HTA PROCERVA (MISCELLANEOUS) ×2 IMPLANT
TOWEL GREEN STERILE FF (TOWEL DISPOSABLE) ×3 IMPLANT

## 2018-03-18 NOTE — Discharge Instructions (Addendum)
HOME INSTRUCTIONS  Please note any unusual or excessive bleeding, pain, swelling. Mild dizziness or drowsiness are normal for about 24 hours after surgery.   Shower when comfortable  Restrictions: No driving for 24 hours or while taking pain medications.  Activity:  No heavy lifting (> 20 lbs), nothing in vagina (no tampons, douching, or intercourse) x 2 weeks; no tub baths for 2 weeks Vaginal spotting is expected but if your bleeding is heavy, period like,  please call the office  Diet:  You may return to your regular diet.  Do not eat large meals.  Eat small frequent meals throughout the day.  Continue to drink a good amount of water at least 6-8 glasses of water per day, hydration is very important for the healing process.  Pain Management: Take over the counter Ibuprofen (Advil) and/or Tylenol as needed for pain. No ibuprofen until 6:00pm  Always take prescription pain medication with food, it may cause constipation, increase fluids and fiber and you may want to take an over-the-counter stool softener like Colace as needed up to 2x a day.    Alcohol -- Avoid for 24 hours and while taking pain medications.  Nausea: Take sips of ginger ale or soda  Fever -- Call physician if temperature over 101 degrees  Follow up:  If you do not already have a follow up appointment scheduled, please call the office at [phone].  If you experience fever (a temperature greater than 100.4), pain unrelieved by pain medication, shortness of breath, swelling of a single leg, or any other symptoms which are concerning to you please the office immediately.   Post Anesthesia Home Care Instructions  Activity: Get plenty of rest for the remainder of the day. A responsible individual must stay with you for 24 hours following the procedure.  For the next 24 hours, DO NOT: -Drive a car -Paediatric nurse -Drink alcoholic beverages -Take any medication unless instructed by your physician -Make any legal  decisions or sign important papers.  Meals: Start with liquid foods such as gelatin or soup. Progress to regular foods as tolerated. Avoid greasy, spicy, heavy foods. If nausea and/or vomiting occur, drink only clear liquids until the nausea and/or vomiting subsides. Call your physician if vomiting continues.  Special Instructions/Symptoms: Your throat may feel dry or sore from the anesthesia or the breathing tube placed in your throat during surgery. If this causes discomfort, gargle with warm salt water. The discomfort should disappear within 24 hours.  If you had a scopolamine patch placed behind your ear for the management of post- operative nausea and/or vomiting:  1. The medication in the patch is effective for 72 hours, after which it should be removed.  Wrap patch in a tissue and discard in the trash. Wash hands thoroughly with soap and water. 2. You may remove the patch earlier than 72 hours if you experience unpleasant side effects which may include dry mouth, dizziness or visual disturbances. 3. Avoid touching the patch. Wash your hands with soap and water after contact with the patch.

## 2018-03-18 NOTE — Anesthesia Preprocedure Evaluation (Addendum)
Anesthesia Evaluation  Patient identified by MRN, date of birth, ID band Patient awake    Reviewed: Allergy & Precautions, NPO status , Patient's Chart, lab work & pertinent test results  Airway Mallampati: I  TM Distance: >3 FB Neck ROM: Full    Dental no notable dental hx. (+) Teeth Intact, Caps   Pulmonary neg pulmonary ROS,    Pulmonary exam normal breath sounds clear to auscultation       Cardiovascular hypertension, Pt. on medications Normal cardiovascular exam Rhythm:Regular Rate:Normal     Neuro/Psych negative neurological ROS  negative psych ROS   GI/Hepatic negative GI ROS, Neg liver ROS,   Endo/Other  Hypothyroidism Morbid obesity  Renal/GU negative Renal ROS  negative genitourinary   Musculoskeletal negative musculoskeletal ROS (+)   Abdominal (+) + obese,   Peds  Hematology negative hematology ROS (+)   Anesthesia Other Findings   Reproductive/Obstetrics AUB                            Anesthesia Physical Anesthesia Plan  ASA: III  Anesthesia Plan: General   Post-op Pain Management:    Induction: Intravenous  PONV Risk Score and Plan: 4 or greater and Scopolamine patch - Pre-op, Midazolam, Ondansetron, Dexamethasone and Treatment may vary due to age or medical condition  Airway Management Planned: LMA  Additional Equipment:   Intra-op Plan:   Post-operative Plan: Extubation in OR  Informed Consent: I have reviewed the patients History and Physical, chart, labs and discussed the procedure including the risks, benefits and alternatives for the proposed anesthesia with the patient or authorized representative who has indicated his/her understanding and acceptance.   Dental advisory given  Plan Discussed with: CRNA and Surgeon  Anesthesia Plan Comments:        Anesthesia Quick Evaluation

## 2018-03-18 NOTE — Transfer of Care (Signed)
Immediate Anesthesia Transfer of Care Note  Patient: [name]  Procedure(s) Performed: DILATATION & CURETTAGE/HYSTEROSCOPY WITH HYDROTHERMAL ABLATION (N/A Uterus)  Patient Location: PACU  Anesthesia Type:General  Level of Consciousness: sedated  Airway & Oxygen Therapy: Patient Spontanous Breathing and Patient connected to face mask oxygen  Post-op Assessment: Report given to RN and Post -op Vital signs reviewed and stable  Post vital signs: Reviewed and stable  Last Vitals:  Vitals Value Taken Time  BP    Temp    Pulse 89 03/18/2018 11:21 AM  Resp 11 03/18/2018 11:21 AM  SpO2 93 % 03/18/2018 11:21 AM  Vitals shown include unvalidated device data.  Last Pain:  Vitals:   03/18/18 0956  TempSrc: Oral  PainSc: 0-No pain         Complications: No apparent anesthesia complications

## 2018-03-18 NOTE — Interval H&P Note (Signed)
History and Physical Interval Note:  03/18/2018 10:06 AM  [name]  has presented today for surgery, with the diagnosis of N93.9 Abnomal uterine bleeding  The various methods of treatment have been discussed with the patient and family. After consideration of risks, benefits and other options for treatment, the patient has consented to  Procedure(s): DILATATION & CURETTAGE/HYSTEROSCOPY WITH HYDROTHERMAL ABLATION (N/A) as a surgical intervention .  The patient's history has been reviewed, patient examined, no change in status, stable for surgery.  I have reviewed the patient's chart and labs.  Questions were answered to the patient's satisfaction.     [name]

## 2018-03-18 NOTE — Op Note (Signed)
Operative Report  PreOp: Abnormal uterine bleeding PostOp: same Procedure:  Hysteroscopy, Dilation and Curettage, Endometrial ablation (HTA) Surgeon: Dr. Janyth Pupa Anesthesia: General, cervical block Complications:none EBL: 96GE IVF:1000cc  Findings:10cm uterus with proliferative endometrium, both ostia visualized  Specimens: endometrial curettings   Procedure: The patient was taken to the operating room where she underwent general anesthesia without difficulty. The patient was placed in a low lithotomy position using Allen stirrups. The patient was examined with the findings as noted above.  She was then prepped and draped in the normal sterile fashion. A sterile speculum was inserted into the vagina. 20cc of Lidocaine with epinephrine were used for a cervical block.  A single tooth tenaculum was placed on the anterior lip of the cervix. The uterus was then sounded to 10cm. The endocervical canal was then serially dilated to 17 using Hank dilators.  The diagnostic hysteroscope was then inserted without difficulty and noted to have the findings as listed above. The hysteroscope was removed and sharp curettage was performed. The tissue was sent to pathology.   Attention was then turned to the HTA system. The hysteroscope was set up according to manufacture instructions. The system was activated and ablation occurred without complications- adequate visualization was achieved throughout the procedure.  Global ablation was visualized and no uterine perforation was seen. All instrument were removed. Hemostasis was observed at the cervical site using silver nitrate.The patient was repositioned to the supine position. The patient tolerated the procedure without any complications and taken to recovery in stable condition.   Janyth Pupa, DO [phone] (pager) [phone] (office)

## 2018-03-18 NOTE — Anesthesia Procedure Notes (Signed)
Procedure Name: LMA Insertion Date/Time: 03/18/2018 10:34 AM Performed by: Willa Frater, CRNA Pre-anesthesia Checklist: Patient identified, Emergency Drugs available, Suction available and Patient being monitored Patient Re-evaluated:Patient Re-evaluated prior to induction Oxygen Delivery Method: Circle system utilized Preoxygenation: Pre-oxygenation with 100% oxygen Induction Type: IV induction Ventilation: Mask ventilation without difficulty LMA: LMA inserted LMA Size: 4.0 Number of attempts: 1 Airway Equipment and Method: Bite block Placement Confirmation: positive ETCO2 Tube secured with: Tape Dental Injury: Teeth and Oropharynx as per pre-operative assessment

## 2018-03-18 NOTE — Anesthesia Postprocedure Evaluation (Signed)
Anesthesia Post Note  Patient: [name]  Procedure(s) Performed: DILATATION & CURETTAGE/HYSTEROSCOPY WITH HYDROTHERMAL ABLATION (N/A Uterus)     Patient location during evaluation: PACU Anesthesia Type: General Level of consciousness: awake and alert and oriented Pain management: pain level controlled Vital Signs Assessment: post-procedure vital signs reviewed and stable Respiratory status: spontaneous breathing, nonlabored ventilation and respiratory function stable Cardiovascular status: blood pressure returned to baseline and stable Postop Assessment: no apparent nausea or vomiting Anesthetic complications: no    Last Vitals:  Vitals:   03/18/18 1215 03/18/18 1230  BP: (!) 154/97 (!) 151/95  Pulse: 76 79  Resp: 14 15  Temp:    SpO2: 100% 97%    Last Pain:  Vitals:   03/18/18 1200  TempSrc:   PainSc: Asleep                 Anuoluwapo Mefferd A.

## 2018-03-23 ENCOUNTER — Encounter (HOSPITAL_BASED_OUTPATIENT_CLINIC_OR_DEPARTMENT_OTHER): Payer: Self-pay | Admitting: Obstetrics & Gynecology

## 2018-06-19 ENCOUNTER — Ambulatory Visit
Admission: RE | Admit: 2018-06-19 | Discharge: 2018-06-19 | Disposition: A | Discharge status: Home / Self Care | Payer: Managed Care, Other (non HMO) | Source: Ambulatory Visit | Attending: Family Medicine | Admitting: Family Medicine

## 2018-06-19 ENCOUNTER — Other Ambulatory Visit: Payer: Self-pay | Admitting: Family Medicine

## 2018-06-19 DIAGNOSIS — S93401A Sprain of unspecified ligament of right ankle, initial encounter: Secondary | ICD-10-CM

## 2018-10-01 ENCOUNTER — Other Ambulatory Visit: Payer: Self-pay | Admitting: Family Medicine

## 2018-10-01 DIAGNOSIS — R1031 Right lower quadrant pain: Secondary | ICD-10-CM

## 2021-08-13 ENCOUNTER — Encounter (HOSPITAL_COMMUNITY): Payer: Self-pay | Admitting: Emergency Medicine

## 2021-08-13 ENCOUNTER — Other Ambulatory Visit: Payer: Self-pay

## 2021-08-13 ENCOUNTER — Emergency Department (HOSPITAL_COMMUNITY)
Admission: EM | Admit: 2021-08-13 | Discharge: 2021-08-14 | Disposition: A | Discharge status: Home / Self Care | Payer: Managed Care, Other (non HMO) | Attending: Emergency Medicine | Admitting: Emergency Medicine

## 2021-08-13 ENCOUNTER — Emergency Department (HOSPITAL_COMMUNITY): Payer: Managed Care, Other (non HMO)

## 2021-08-13 DIAGNOSIS — D72829 Elevated white blood cell count, unspecified: Secondary | ICD-10-CM | POA: Diagnosis not present

## 2021-08-13 DIAGNOSIS — R1032 Left lower quadrant pain: Secondary | ICD-10-CM | POA: Diagnosis present

## 2021-08-13 DIAGNOSIS — Z79899 Other long term (current) drug therapy: Secondary | ICD-10-CM | POA: Diagnosis not present

## 2021-08-13 DIAGNOSIS — I10 Essential (primary) hypertension: Secondary | ICD-10-CM | POA: Insufficient documentation

## 2021-08-13 DIAGNOSIS — E039 Hypothyroidism, unspecified: Secondary | ICD-10-CM | POA: Diagnosis not present

## 2021-08-13 DIAGNOSIS — N200 Calculus of kidney: Secondary | ICD-10-CM | POA: Insufficient documentation

## 2021-08-13 DIAGNOSIS — N12 Tubulo-interstitial nephritis, not specified as acute or chronic: Secondary | ICD-10-CM

## 2021-08-13 DIAGNOSIS — K5792 Diverticulitis of intestine, part unspecified, without perforation or abscess without bleeding: Secondary | ICD-10-CM | POA: Insufficient documentation

## 2021-08-13 LAB — COMPREHENSIVE METABOLIC PANEL
ALT: 19 U/L (ref 0–44)
AST: 14 U/L — ABNORMAL LOW (ref 15–41)
Albumin: 3.8 g/dL (ref 3.5–5.0)
Alkaline Phosphatase: 106 U/L (ref 38–126)
Anion gap: 8 (ref 5–15)
BUN: 14 mg/dL (ref 6–20)
CO2: 24 mmol/L (ref 22–32)
Calcium: 8.5 mg/dL — ABNORMAL LOW (ref 8.9–10.3)
Chloride: 108 mmol/L (ref 98–111)
Creatinine, Ser: 0.8 mg/dL (ref 0.44–1.00)
GFR, Estimated: >60 mL/min (ref >60)
Glucose, Bld: 96 mg/dL (ref 70–99)
Potassium: 3.4 mmol/L — ABNORMAL LOW (ref 3.5–5.1)
Sodium: 140 mmol/L (ref 135–145)
Total Bilirubin: 0.6 mg/dL (ref 0.3–1.2)
Total Protein: 8 g/dL (ref 6.5–8.1)

## 2021-08-13 LAB — CBC
HCT: 43.4 % (ref 36.0–46.0)
Hemoglobin: 14.8 g/dL (ref 12.0–15.0)
MCH: 30.2 pg (ref 26.0–34.0)
MCHC: 34.1 g/dL (ref 30.0–36.0)
MCV: 88.6 fL (ref 80.0–100.0)
Platelets: 269 10*3/uL (ref 150–400)
RBC: 4.9 MIL/uL (ref 3.87–5.11)
RDW: 12.8 % (ref 11.5–15.5)
WBC: 12.5 10*3/uL — ABNORMAL HIGH (ref 4.0–10.5)
nRBC: 0 % (ref 0.0–0.2)

## 2021-08-13 LAB — URINALYSIS, ROUTINE W REFLEX MICROSCOPIC
Bilirubin Urine: NEGATIVE
Glucose, UA: NEGATIVE mg/dL
Ketones, ur: NEGATIVE mg/dL
Nitrite: POSITIVE — AB
Protein, ur: NEGATIVE mg/dL
Specific Gravity, Urine: 1.015 (ref 1.005–1.030)
pH: 6 (ref 5.0–8.0)

## 2021-08-13 LAB — I-STAT BETA HCG BLOOD, ED (MC, WL, AP ONLY): I-stat hCG, quantitative: <5 m[IU]/mL (ref <5)

## 2021-08-13 LAB — LIPASE, BLOOD: Lipase: 32 U/L (ref 11–51)

## 2021-08-13 MED ORDER — IOHEXOL 300 MG/ML  SOLN
100.0000 mL | Freq: Once | INTRAMUSCULAR | Status: AC | PRN
  Administered 2021-08-13: 100 mL via INTRAVENOUS

## 2021-08-13 NOTE — ED Triage Notes (Signed)
Pt states she has has left lower abdominal pain for 2 days which worsened today. Pt reports tenderness to touch. Pt reports nausea with no emesis. Pt has hx of hypertension and missed her blood pressure medicine this morning. ?

## 2021-08-13 NOTE — ED Provider Triage Note (Signed)
Emergency Medicine Provider Triage Evaluation Note ? ?[name] , a 51 y.o. female  was evaluated in triage.  Pt complains of left lower quadrant abdominal pain.  And 2 days ago.  Worsening today.  Has had some nausea without emesis.  No diarrhea, loose stools.  No history of diverticulosis.  Pain does not radiate to back.  Denies chance of pregnancy. ? ?Review of Systems  ?Positive: Abd pain, nausea ?Negative: Fever, diarrhea ? ?Physical Exam  ?BP (!) 189/104   Pulse 78   Temp 97.7 ?F (36.5 ?C) (Oral)   Resp 14   Ht '5\' 2"'$  (1.575 m)   Wt 120.2 kg   SpO2 98%   BMI 48.47 kg/m?  ?Gen:   Awake, no distress   ?Resp:  Normal effort  ?ABD:  Diffuse tenderness to left lower quadrant ?MSK:   Moves extremities without difficulty  ?Other:   ? ?Medical Decision Making  ?Medically screening exam initiated at 7:58 PM.  Appropriate orders placed.  [name] was informed that the remainder of the evaluation will be completed by another provider, this initial triage assessment does not replace that evaluation, and the importance of remaining in the ED until their evaluation is complete. ? ?LLQ abd pain ?  ?[name] A, PA-C ?08/13/21 1959 ? ?

## 2021-08-14 MED ORDER — ONDANSETRON 4 MG PO TBDP: 4.0000 mg | ORAL_TABLET | Freq: Three times a day (TID) | ORAL | 0 refills | Status: AC | PRN

## 2021-08-14 MED ORDER — OXYCODONE-ACETAMINOPHEN 5-325 MG PO TABS
1.0000 | ORAL_TABLET | Freq: Once | ORAL | Status: AC
  Administered 2021-08-14: 1 via ORAL
  Filled 2021-08-14: qty 1

## 2021-08-14 MED ORDER — AMOXICILLIN-POT CLAVULANATE 875-125 MG PO TABS: 1.0000 | ORAL_TABLET | Freq: Two times a day (BID) | ORAL | 0 refills | Status: AC

## 2021-08-14 MED ORDER — SODIUM CHLORIDE 0.9 % IV SOLN
1.0000 g | Freq: Once | INTRAVENOUS | Status: AC
  Administered 2021-08-14: 1 g via INTRAVENOUS
  Filled 2021-08-14: qty 10

## 2021-08-14 NOTE — ED Notes (Signed)
Lab called and notified to add on culture to urine specimen ?

## 2021-08-14 NOTE — ED Provider Notes (Signed)
?[facility] DEPT ?Provider Note ? ? ?CSN: [id number] ?Arrival date & time: 08/13/21  1927 ? ?  ? ?History ? ?Chief Complaint  ?Patient presents with  ? Abdominal Pain  ? ? ?[name] is a 51 y.o. female. ? ?The history is provided by the patient.  ?Abdominal Pain ?[name] is a 51 y.o. female who presents to the Emergency Department complaining of abdominal pain.  She presents to the ED for evaluation of LLQ pain that started two days ago.  Pain is dull and constant but sharp at times. Has associated nausea.  No prior similar sxs.   ? ?No fever/chills, vomiting, diarrhea, dysuria.   ? ?Hx/o hypothyroid, HTN.   ?  ? ?Home Medications ?Prior to Admission medications   ?Medication Sig Start Date End Date Taking? Authorizing Provider  ?amoxicillin-clavulanate (AUGMENTIN) 875-125 MG tablet Take 1 tablet by mouth every 12 (twelve) hours. 08/14/21  Yes Quintella Reichert, MD  ?ondansetron (ZOFRAN-ODT) 4 MG disintegrating tablet Take 1 tablet (4 mg total) by mouth every 8 (eight) hours as needed for nausea or vomiting. 08/14/21  Yes Quintella Reichert, MD  ?levothyroxine (SYNTHROID, LEVOTHROID) 200 MCG tablet Take 112 mcg by mouth daily before breakfast.     [provider]  ?lisinopril (PRINIVIL,ZESTRIL) 10 MG tablet Take 10 mg by mouth daily.    [provider]  ?   ? ?Allergies    ?Citric acid   ? ?Review of Systems   ?Review of Systems  ?Gastrointestinal:  Positive for abdominal pain.  ?All other systems reviewed and are negative. ? ?Physical Exam ?Updated Vital Signs ?BP (!) 166/98   Pulse 80   Temp 97.7 ?F (36.5 ?C) (Oral)   Resp 18   Ht '5\' 2"'$  (1.575 m)   Wt 120.2 kg   SpO2 94%   BMI 48.47 kg/m?  ?Physical Exam ?Vitals and nursing note reviewed.  ?Constitutional:   ?   Appearance: She is well-developed.  ?HENT:  ?   Head: Normocephalic and atraumatic.  ?Cardiovascular:  ?   Rate and Rhythm: Normal rate and regular rhythm.  ?Pulmonary:  ?   Effort: Pulmonary  effort is normal. No respiratory distress.  ?Abdominal:  ?   Palpations: Abdomen is soft.  ?   Tenderness: There is left CVA tenderness. There is no guarding or rebound.  ?   Comments: Moderate LLQ tenderness  ?Musculoskeletal:     ?   General: No tenderness.  ?Skin: ?   General: Skin is warm and dry.  ?Neurological:  ?   Mental Status: She is alert and oriented to person, place, and time.  ?Psychiatric:     ?   Behavior: Behavior normal.  ? ? ?ED Results / Procedures / Treatments   ?Labs ?(all labs ordered are listed, but only abnormal results are displayed) ?Labs Reviewed  ?COMPREHENSIVE METABOLIC PANEL - Abnormal; Notable for the following components:  ?    Result Value  ? Potassium 3.4 (*)   ? Calcium 8.5 (*)   ? AST 14 (*)   ? All other components within normal limits  ?CBC - Abnormal; Notable for the following components:  ? WBC 12.5 (*)   ? All other components within normal limits  ?URINALYSIS, ROUTINE W REFLEX MICROSCOPIC - Abnormal; Notable for the following components:  ? APPearance HAZY (*)   ? Hgb urine dipstick SMALL (*)   ? Nitrite POSITIVE (*)   ? Leukocytes,Ua TRACE (*)   ? Bacteria, UA MANY (*)   ?  All other components within normal limits  ?URINE CULTURE  ?LIPASE, BLOOD  ?I-STAT BETA HCG BLOOD, ED ([facility], [facility], [facility] ONLY)  ? ? ?EKG ?None ? ?Radiology ?CT Abdomen Pelvis W Contrast ? ?Result Date: 08/13/2021 ?CLINICAL DATA:  Left lower quadrant pain.  Pain for 2 days.  Nausea. EXAM: CT ABDOMEN AND PELVIS WITH CONTRAST TECHNIQUE: Multidetector CT imaging of the abdomen and pelvis was performed using the standard protocol following bolus administration of intravenous contrast. RADIATION DOSE REDUCTION: This exam was performed according to the departmental dose-optimization program which includes automated exposure control, adjustment of the mA and/or kV according to patient size and/or use of iterative reconstruction technique. CONTRAST:  18m OMNIPAQUE IOHEXOL 300 MG/ML  SOLN COMPARISON:  CT 05/29/2013  FINDINGS: Lower chest: Lower right infrahilar node has diminished in size from prior exam. No acute airspace disease or pleural effusion. The heart is normal in size. Hepatobiliary: Mild hepatic steatosis without focal hepatic lesion. Gallbladder physiologically distended, no calcified stone. No biliary dilatation. Pancreas: No ductal dilatation or inflammation. Spleen: Normal in size without focal abnormality. Splenule at the hilum. Adrenals/Urinary Tract: Normal adrenal glands. No hydronephrosis or perinephric edema. Homogeneous renal enhancement with symmetric excretion on delayed phase imaging. Punctate nonobstructing stone in the lower left kidney. 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 No dedicated follow-up is recommended. Urinary bladder is minimally distended without wall thickening. Stomach/Bowel: Inflamed diverticulum involving the distal descending/sigmoid colon junction, series 2, image 55. Pericolonic fat stranding with minimal focal colonic wall thickening. Unremarkable appearance of the stomach. Vascular/Lymphatic: Normal caliber abdominal aorta. Patent portal and splenic veins. Multiple small retroperitoneal lymph nodes are not enlarged by size criteria. No enlarged lymph nodes in the abdomen or pelvis. Reproductive: Uterus and bilateral adnexa are unremarkable. Other: No free air, free fluid, or intra-abdominal fluid collection. No abdominal wall hernia. Musculoskeletal: There are no acute or suspicious osseous abnormalities. There are scattered bone islands in the pelvis. IMPRESSION: 1. Uncomplicated acute diverticulitis at the distal descending/sigmoid colon junction. 2. Mild hepatic steatosis. 3. Punctate nonobstructing left renal stone. Electronically Signed   By: MKeith RakeM.D.   On: 08/13/2021 21:05   ? ?Procedures ?Procedures  ? ? ?Medications Ordered in ED ?Medications  ?cefTRIAXone (ROCEPHIN) 1 g in sodium  chloride 0.9 % 100 mL IVPB (has no administration in time range)  ?oxyCODONE-acetaminophen (PERCOCET/ROXICET) 5-325 MG per tablet 1 tablet (has no administration in time range)  ?iohexol (OMNIPAQUE) 300 MG/ML solution 100 mL (100 mLs Intravenous Contrast Given 08/13/21 2048)  ? ? ?ED Course/ Medical Decision Making/ A&P ?  ?                        ?Medical Decision Making ?Amount and/or Complexity of Data Reviewed ?Labs: ordered. ? ?Risk ?Prescription drug management. ? ? ?Pt here for evaluation of left sided abdominal pain.  She has tenderness on examination without peritoneal findings.  CBC with leukocytosis.  UA is consistent with UTI.  CT scan demonstrates acute uncomplicated diverticulitis.  Given patient's nausea, leukocytosis will start with 1 dose of IV antibiotics in the emergency department.  She is tolerating oral fluids.  Plan to discharge home on antibiotics that should cover both diverticulitis as well as her urinary tract infection.  We will send cultures.  Discussed with patient home care for UTI and diverticulitis as well as outpatient follow-up and return precautions. ? ?Current clinical picture is  not consistent with infected kidney stone, abscess, sepsis. ? ? ? ? ? ? ? ?Final Clinical Impression(s) / ED Diagnoses ?Final diagnoses:  ?Pyelonephritis  ?Acute diverticulitis  ? ? ?Rx / DC Orders ?ED Discharge Orders   ? ?      Ordered  ?  amoxicillin-clavulanate (AUGMENTIN) 875-125 MG tablet  Every 12 hours       ? 08/14/21 0219  ?  ondansetron (ZOFRAN-ODT) 4 MG disintegrating tablet  Every 8 hours PRN       ? 08/14/21 0219  ? ?  ?  ? ?  ? ? ?  ?Quintella Reichert, MD ?08/14/21 [phone] ? ?

## 2021-08-16 LAB — URINE CULTURE: Culture: >=100000 — AB

## 2021-08-17 ENCOUNTER — Telehealth: Payer: Self-pay | Admitting: Infectious Diseases

## 2021-08-17 NOTE — Telephone Encounter (Signed)
Called patient but no answer regarding follow up on urine cultures.  ? ?Will send her a mychart to see how she is feeling and offer FU with me next week if she does not have PCP.  ? ? ?

## 2021-09-19 ENCOUNTER — Other Ambulatory Visit: Payer: Self-pay | Admitting: Gastroenterology

## 2021-10-18 ENCOUNTER — Encounter (HOSPITAL_COMMUNITY): Payer: Self-pay | Admitting: Gastroenterology

## 2021-10-18 NOTE — Progress Notes (Signed)
Attempted to obtain medical history via telephone, unable to reach at this time. HIPAA compliant voicemail message left requesting return call to pre surgical testing department. 

## 2021-10-30 ENCOUNTER — Ambulatory Visit (HOSPITAL_BASED_OUTPATIENT_CLINIC_OR_DEPARTMENT_OTHER): Payer: Managed Care, Other (non HMO) | Admitting: Anesthesiology

## 2021-10-30 ENCOUNTER — Encounter (HOSPITAL_COMMUNITY): Payer: Self-pay | Admitting: Gastroenterology

## 2021-10-30 ENCOUNTER — Encounter (HOSPITAL_COMMUNITY)
Admission: RE | Disposition: A | Discharge status: Home / Self Care | Payer: Self-pay | Source: Home / Self Care | Attending: Gastroenterology

## 2021-10-30 ENCOUNTER — Ambulatory Visit (HOSPITAL_COMMUNITY)
Admission: RE | Admit: 2021-10-30 | Discharge: 2021-10-30 | Disposition: A | Discharge status: Home / Self Care | Payer: Managed Care, Other (non HMO) | Attending: Gastroenterology | Admitting: Gastroenterology

## 2021-10-30 ENCOUNTER — Ambulatory Visit (HOSPITAL_COMMUNITY): Payer: Managed Care, Other (non HMO) | Admitting: Anesthesiology

## 2021-10-30 DIAGNOSIS — K648 Other hemorrhoids: Secondary | ICD-10-CM | POA: Insufficient documentation

## 2021-10-30 DIAGNOSIS — D125 Benign neoplasm of sigmoid colon: Secondary | ICD-10-CM | POA: Insufficient documentation

## 2021-10-30 DIAGNOSIS — Z6841 Body Mass Index (BMI) 40.0 and over, adult: Secondary | ICD-10-CM | POA: Diagnosis not present

## 2021-10-30 DIAGNOSIS — K635 Polyp of colon: Secondary | ICD-10-CM | POA: Diagnosis not present

## 2021-10-30 DIAGNOSIS — K5730 Diverticulosis of large intestine without perforation or abscess without bleeding: Secondary | ICD-10-CM | POA: Insufficient documentation

## 2021-10-30 DIAGNOSIS — I10 Essential (primary) hypertension: Secondary | ICD-10-CM

## 2021-10-30 DIAGNOSIS — D123 Benign neoplasm of transverse colon: Secondary | ICD-10-CM | POA: Insufficient documentation

## 2021-10-30 DIAGNOSIS — K644 Residual hemorrhoidal skin tags: Secondary | ICD-10-CM | POA: Diagnosis not present

## 2021-10-30 DIAGNOSIS — E039 Hypothyroidism, unspecified: Secondary | ICD-10-CM | POA: Diagnosis not present

## 2021-10-30 DIAGNOSIS — Z1211 Encounter for screening for malignant neoplasm of colon: Secondary | ICD-10-CM

## 2021-10-30 DIAGNOSIS — D1779 Benign lipomatous neoplasm of other sites: Secondary | ICD-10-CM | POA: Diagnosis not present

## 2021-10-30 HISTORY — PX: POLYPECTOMY: SHX5525

## 2021-10-30 HISTORY — PX: COLONOSCOPY WITH PROPOFOL: SHX5780

## 2021-10-30 SURGERY — COLONOSCOPY WITH PROPOFOL
Anesthesia: Monitor Anesthesia Care

## 2021-10-30 MED ORDER — PROPOFOL 10 MG/ML IV BOLUS
INTRAVENOUS | Status: AC
  Filled 2021-10-30: qty 20

## 2021-10-30 MED ORDER — PROPOFOL 1000 MG/100ML IV EMUL
INTRAVENOUS | Status: AC
  Filled 2021-10-30: qty 100

## 2021-10-30 MED ORDER — SODIUM CHLORIDE 0.9 % IV SOLN
INTRAVENOUS | Status: DC
Start: 2021-10-30 — End: 2021-10-30

## 2021-10-30 MED ORDER — PROPOFOL 500 MG/50ML IV EMUL
INTRAVENOUS | Status: DC | PRN
  Administered 2021-10-30: 135 ug/kg/min via INTRAVENOUS

## 2021-10-30 MED ORDER — PROPOFOL 10 MG/ML IV BOLUS
INTRAVENOUS | Status: DC | PRN
  Administered 2021-10-30: 10 mg via INTRAVENOUS
  Administered 2021-10-30: 30 mg via INTRAVENOUS
  Administered 2021-10-30: 10 mg via INTRAVENOUS

## 2021-10-30 MED ORDER — LACTATED RINGERS IV SOLN
INTRAVENOUS | Status: DC
Start: 2021-10-30 — End: 2021-10-30
  Administered 2021-10-30: 1000 mL via INTRAVENOUS

## 2021-10-30 SURGICAL SUPPLY — 22 items

## 2021-10-30 NOTE — Discharge Instructions (Addendum)
YOU HAD AN ENDOSCOPIC PROCEDURE TODAY: Refer to the procedure report and other information in the discharge instructions given to you for any specific questions about what was found during the examination. If this information does not answer your questions, please call [facility] office at [phone] to clarify.   YOU SHOULD EXPECT: Some feelings of bloating in the abdomen. Passage of more gas than usual. Walking can help get rid of the air that was put into your GI tract during the procedure and reduce the bloating. If you had a lower endoscopy (such as a colonoscopy or flexible sigmoidoscopy) you may notice spotting of blood in your stool or on the toilet paper. Some abdominal soreness may be present for a day or two, also.  DIET: Your first meal following the procedure should be a light meal and then it is ok to progress to your normal diet. A half-sandwich or bowl of soup is an example of a good first meal. Heavy or fried foods are harder to digest and may make you feel nauseous or bloated. Drink plenty of fluids but you should avoid alcoholic beverages for 24 hours. If you had a esophageal dilation, please see attached instructions for diet.    ACTIVITY: Your care partner should take you home directly after the procedure. You should plan to take it easy, moving slowly for the rest of the day. You can resume normal activity the day after the procedure however YOU SHOULD NOT DRIVE, use power tools, machinery or perform tasks that involve climbing or major physical exertion for 24 hours (because of the sedation medicines used during the test).   SYMPTOMS TO REPORT IMMEDIATELY: A gastroenterologist can be reached at any hour. Please call [phone]  for any of the following symptoms:  Following lower endoscopy (colonoscopy, flexible sigmoidoscopy) Excessive amounts of blood in the stool  Significant tenderness, worsening of abdominal pains  Swelling of the abdomen that is new, acute  Fever of 100  or higher  Following upper endoscopy (EGD, EUS, ERCP, esophageal dilation) Vomiting of blood or coffee ground material  New, significant abdominal pain  New, significant chest pain or pain under the shoulder blades  Painful or persistently difficult swallowing  New shortness of breath  Black, tarry-looking or red, bloody stools  FOLLOW UP:  If any biopsies were taken you will be contacted by phone or by letter within the next 1-3 weeks. Call [phone]  if you have not heard about the biopsies in 3 weeks.  Please also call with any specific questions about appointments or follow up tests. YOU HAD AN ENDOSCOPIC PROCEDURE TODAY: Refer to the procedure report and other information in the discharge instructions given to you for any specific questions about what was found during the examination. If this information does not answer your questions, please call [facility] office at [phone] to clarify.   YOU SHOULD EXPECT: Some feelings of bloating in the abdomen. Passage of more gas than usual. Walking can help get rid of the air that was put into your GI tract during the procedure and reduce the bloating. If you had a lower endoscopy (such as a colonoscopy or flexible sigmoidoscopy) you may notice spotting of blood in your stool or on the toilet paper. Some abdominal soreness may be present for a day or two, also.  DIET: Your first meal following the procedure should be a light meal and then it is ok to progress to your normal diet. A half-sandwich or bowl of soup is an   example of a good first meal. Heavy or fried foods are harder to digest and may make you feel nauseous or bloated. Drink plenty of fluids but you should avoid alcoholic beverages for 24 hours. If you had a esophageal dilation, please see attached instructions for diet.    ACTIVITY: Your care partner should take you home directly after the procedure. You should plan to take it easy, moving slowly for the rest of the day. You can resume  normal activity the day after the procedure however YOU SHOULD NOT DRIVE, use power tools, machinery or perform tasks that involve climbing or major physical exertion for 24 hours (because of the sedation medicines used during the test).   SYMPTOMS TO REPORT IMMEDIATELY: A gastroenterologist can be reached at any hour. Please call [phone]  for any of the following symptoms:  Following lower endoscopy (colonoscopy, flexible sigmoidoscopy) Excessive amounts of blood in the stool  Significant tenderness, worsening of abdominal pains  Swelling of the abdomen that is new, acute  Fever of 100 or higher  Following upper endoscopy (EGD, EUS, ERCP, esophageal dilation) Vomiting of blood or coffee ground material  New, significant abdominal pain  New, significant chest pain or pain under the shoulder blades  Painful or persistently difficult swallowing  New shortness of breath  Black, tarry-looking or red, bloody stools  FOLLOW UP:  If any biopsies were taken you will be contacted by phone or by letter within the next 1-3 weeks. Call [phone]  if you have not heard about the biopsies in 3 weeks.  Please also call with any specific questions about appointments or follow up tests.  Call if question or problem otherwise begin with soft solids breakfast food and slowly advance diet and follow-up as needed and probable repeat colonoscopy in 3 years

## 2021-10-30 NOTE — Anesthesia Postprocedure Evaluation (Signed)
Anesthesia Post Note  Patient: [name]  Procedure(s) Performed: COLONOSCOPY WITH PROPOFOL POLYPECTOMY     Patient location during evaluation: PACU Anesthesia Type: MAC Level of consciousness: awake and alert Pain management: pain level controlled Vital Signs Assessment: post-procedure vital signs reviewed and stable Respiratory status: spontaneous breathing, nonlabored ventilation and respiratory function stable Cardiovascular status: blood pressure returned to baseline and stable Postop Assessment: no apparent nausea or vomiting Anesthetic complications: no   No notable events documented.  Last Vitals:  Vitals:   10/30/21 1240 10/30/21 1250  BP: 101/71 132/79  Pulse: 79 81  Resp: 18 (!) 22  Temp:    SpO2: 100% 98%    Last Pain:  Vitals:   10/30/21 1250  TempSrc:   PainSc: 0-No pain                 Pervis Hocking

## 2021-10-30 NOTE — Progress Notes (Signed)
[name] 11:52 AM  Subjective: Patient asymptomatic from a GI standpoint and her family history is negative and she had a colonoscopy years ago and is not on any aspirin or blood thinners and has no other complaints  Objective: Vital signs stable afebrile exam please see preassessment evaluation no acute distress in good spirits our office and hospital computer chart reviewed  Assessment: Colon screening  Plan: Okay to proceed with colonoscopy with anesthesia assistance  [facility]  office [phone] After 5PM or if no answer call [phone]

## 2021-10-30 NOTE — Op Note (Signed)
[facility] Patient Name: [name] Procedure Date: 10/30/2021 MRN: [id number] Attending MD: Clarene Essex , MD Date of Birth: 09-Feb-1971 CSN: [id number] Age: 51 Admit Type: Outpatient Procedure:                Colonoscopy Indications:              Screening for colorectal malignant neoplasm Providers:                Clarene Essex, MD, Benay Pillow, RN, Darliss Cheney,                            Technician Referring MD:              Medicines:                Monitored Anesthesia Care Complications:            No immediate complications. Estimated Blood Loss:     Estimated blood loss: none. Procedure:                Pre-Anesthesia Assessment:                           - Prior to the procedure, a History and Physical                            was performed, and patient medications and                            allergies were reviewed. The patient's tolerance of                            previous anesthesia was also reviewed. The risks                            and benefits of the procedure and the sedation                            options and risks were discussed with the patient.                            All questions were answered, and informed consent                            was obtained. Prior Anticoagulants: The patient has                            taken no previous anticoagulant or antiplatelet                            agents. ASA Grade Assessment: III - A patient with                            severe systemic disease. After reviewing the risks  and benefits, the patient was deemed in                            satisfactory condition to undergo the procedure.                           After obtaining informed consent, the colonoscope                            was passed under direct vision. Throughout the                            procedure, the patient's blood pressure, pulse, and                            oxygen saturations  were monitored continuously. The                            CF-HQ190L ([id number] Olympus colonoscope was                            introduced through the anus and advanced to the the                            cecum, identified by appendiceal orifice and                            ileocecal valve. The ileocecal valve, appendiceal                            orifice, and rectum were photographed. The                            colonoscopy was performed without difficulty. The                            patient tolerated the procedure well. The quality                            of the bowel preparation was adequate. Scope In: 12:05:17 PM Scope Out: 12:32:21 PM Scope Withdrawal Time: 0 hours 22 minutes 45 seconds  Total Procedure Duration: 0 hours 27 minutes 4 seconds  Findings:      External and internal hemorrhoids were found during retroflexion, during       perianal exam and during digital exam. The hemorrhoids were small.      A few small-mouthed diverticula were found in the sigmoid colon and       distal descending colon.      A medium polyp was found in the distal sigmoid colon. The polyp was       pedunculated. The polyp was removed with a hot snare. Resection and       retrieval were complete.      A small polyp was found in the distal sigmoid colon. The polyp was       semi-sessile. The polyp was removed with a  hot snare. Resection and       retrieval were complete.      A medium polyp was found in the proximal transverse colon. The polyp was       semi-sessile. The polyp was removed with a piecemeal technique using a       hot snare. Resection and retrieval were complete.      The exam was otherwise without abnormality.      There was a small lipoma, in the ascending colon. Impression:               - External and internal hemorrhoids.                           - Diverticulosis in the sigmoid colon and in the                            distal descending colon.                            - One medium polyp in the distal sigmoid colon,                            removed with a hot snare. Resected and retrieved.                           - One small polyp in the distal sigmoid colon,                            removed with a hot snare. Resected and retrieved.                           - One medium polyp in the proximal transverse                            colon, removed piecemeal using a hot snare.                            Resected and retrieved.                           - The examination was otherwise normal.                           - Small lipoma in the ascending colon. Moderate Sedation:      Not Applicable - Patient had care per Anesthesia. Recommendation:           - Patient has a contact number available for                            emergencies. The signs and symptoms of potential                            delayed complications were discussed with the  patient. Return to normal activities tomorrow.                            Written discharge instructions were provided to the                            patient.                           - Soft diet today.                           - Continue present medications.                           - Await pathology results.                           - Repeat colonoscopy in 3 years for surveillance                            based on pathology results.                           - Return to GI office PRN.                           - Telephone GI clinic for pathology results in 1                            week.                           - Telephone GI clinic if symptomatic PRN. Procedure Code(s):        --- Professional ---                           [phone], Colonoscopy, flexible; with removal of                            tumor(s), polyp(s), or other lesion(s) by snare                            technique Diagnosis Code(s):        --- Professional ---                            Z12.11, Encounter for screening for malignant                            neoplasm of colon                           K63.5, Polyp of colon                           K57.30, Diverticulosis of large intestine without  perforation or abscess without bleeding CPT copyright 2019 [employer]. All rights reserved. The codes documented in this report are preliminary and upon coder review may  be revised to meet current compliance requirements. Clarene Essex, MD 10/30/2021 12:57:51 PM This report has been signed electronically. Number of Addenda: 0

## 2021-10-30 NOTE — Transfer of Care (Signed)
Immediate Anesthesia Transfer of Care Note  Patient: [name]  Procedure(s) Performed: COLONOSCOPY WITH PROPOFOL POLYPECTOMY  Patient Location: PACU  Anesthesia Type:General  Level of Consciousness: drowsy  Airway & Oxygen Therapy: Patient Spontanous Breathing and Patient connected to face mask oxygen  Post-op Assessment: Report given to RN, Post -op Vital signs reviewed and stable and Patient moving all extremities X 4  Post vital signs: Reviewed and stable  Last Vitals:  Vitals Value Taken Time  BP 105/61   Temp    Pulse 81   Resp 12   SpO2 100     Last Pain:  Vitals:   10/30/21 1054  TempSrc: Tympanic  PainSc: 0-No pain         Complications: No notable events documented.

## 2021-10-30 NOTE — Anesthesia Procedure Notes (Signed)
Procedure Name: MAC Date/Time: 10/30/2021 11:59 AM  Performed by: Niel Hummer, CRNAPre-anesthesia Checklist: Patient identified, Emergency Drugs available, Suction available and Patient being monitored Oxygen Delivery Method: Simple face mask

## 2021-10-30 NOTE — Anesthesia Preprocedure Evaluation (Addendum)
Anesthesia Evaluation  Patient identified by MRN, date of birth, ID band Patient awake    Reviewed: Allergy & Precautions, NPO status , Patient's Chart, lab work & pertinent test results  Airway Mallampati: IV  TM Distance: >3 FB Neck ROM: Full    Dental no notable dental hx. (+) Teeth Intact, Dental Advisory Given   Pulmonary neg pulmonary ROS,  Snores occasionally, has never been tested for sleep apnea   Pulmonary exam normal breath sounds clear to auscultation       Cardiovascular hypertension, Pt. on medications Normal cardiovascular exam Rhythm:Regular Rate:Normal     Neuro/Psych negative neurological ROS  negative psych ROS   GI/Hepatic negative GI ROS, Neg liver ROS,   Endo/Other  Hypothyroidism Morbid obesityBMI 49  Renal/GU negative Renal ROS  negative genitourinary   Musculoskeletal negative musculoskeletal ROS (+)   Abdominal   Peds  Hematology negative hematology ROS (+)   Anesthesia Other Findings   Reproductive/Obstetrics negative OB ROS                           Anesthesia Physical Anesthesia Plan  ASA: 3  Anesthesia Plan: MAC   Post-op Pain Management:    Induction:   PONV Risk Score and Plan: 2 and Propofol infusion and TIVA  Airway Management Planned: Natural Airway and Simple Face Mask  Additional Equipment: None  Intra-op Plan:   Post-operative Plan:   Informed Consent: I have reviewed the patients History and Physical, chart, labs and discussed the procedure including the risks, benefits and alternatives for the proposed anesthesia with the patient or authorized representative who has indicated his/her understanding and acceptance.     Dental advisory given  Plan Discussed with: CRNA  Anesthesia Plan Comments:        Anesthesia Quick Evaluation

## 2021-10-31 ENCOUNTER — Encounter (HOSPITAL_COMMUNITY): Payer: Self-pay | Admitting: Gastroenterology

## 2021-10-31 LAB — SURGICAL PATHOLOGY

## 2022-02-28 ENCOUNTER — Other Ambulatory Visit (HOSPITAL_COMMUNITY)
Admission: RE | Admit: 2022-02-28 | Discharge: 2022-02-28 | Disposition: A | Discharge status: Home / Self Care | Payer: Managed Care, Other (non HMO) | Source: Ambulatory Visit | Attending: Nurse Practitioner | Admitting: Nurse Practitioner

## 2022-02-28 DIAGNOSIS — R87610 Atypical squamous cells of undetermined significance on cytologic smear of cervix (ASC-US): Secondary | ICD-10-CM | POA: Insufficient documentation

## 2022-03-06 LAB — CYTOLOGY - PAP
Comment: NEGATIVE
Diagnosis: NEGATIVE
Diagnosis: REACTIVE
High risk HPV: NEGATIVE

## 2022-07-06 IMAGING — CT CT ABD-PELV W/ CM
2 of 5 series · 16 of 46 positions shown, 18 images · IV contrast (agent unspecified)
Comparison: CT 05/29/2013

CLINICAL DATA: Left lower quadrant pain.  Pain for 2 days.  Nausea.

EXAM:
CT ABDOMEN AND PELVIS WITH CONTRAST
TECHNIQUE: Multidetector CT imaging of the abdomen and pelvis was performed
using the standard protocol following bolus administration of
intravenous contrast.

[Series 2: axial st · axial · 0.97mm/px · z∈[-414,-44]mm · 13 of 88 slices shown, 15 images]
[im 7/88  soft-tissue]
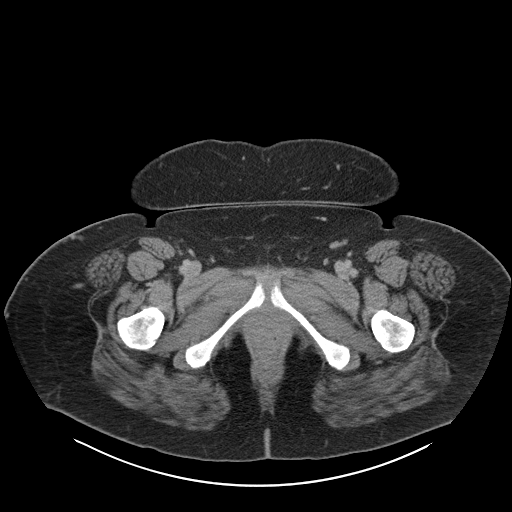
[im 7/88  bone]
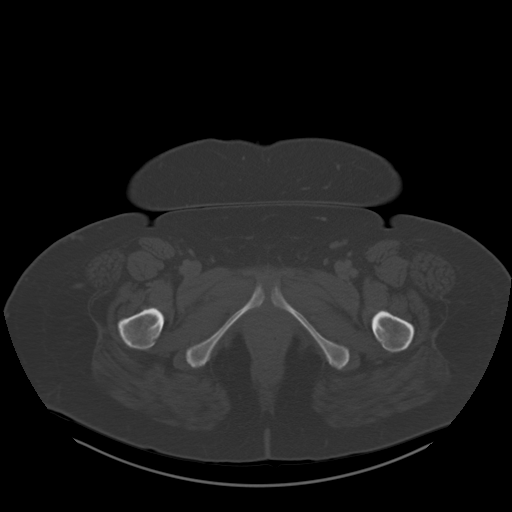
[im 13/88  soft-tissue]
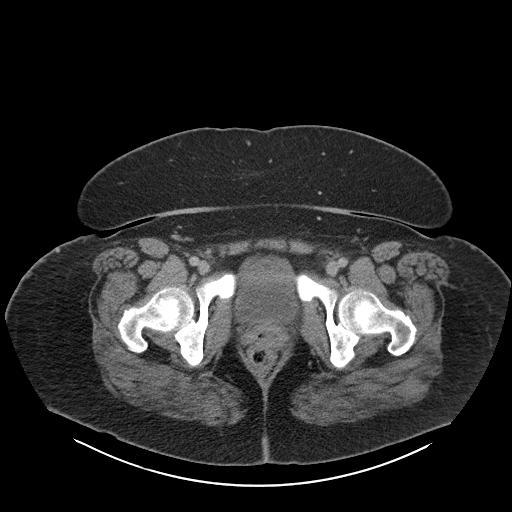
[im 19/88  soft-tissue]
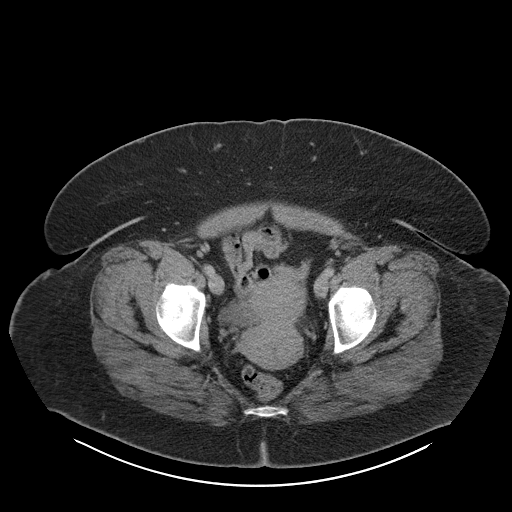
[im 25/88  soft-tissue]
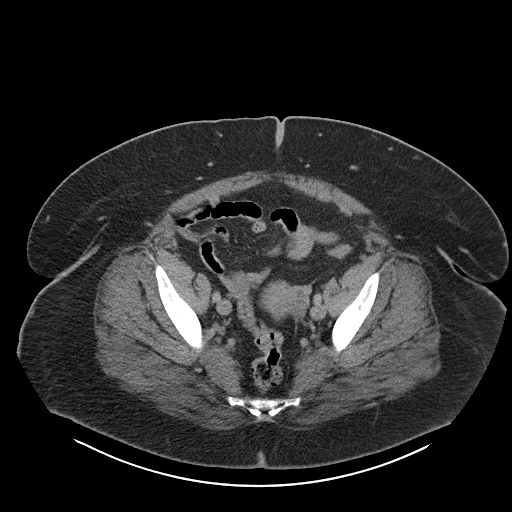
[im 32/88  soft-tissue]
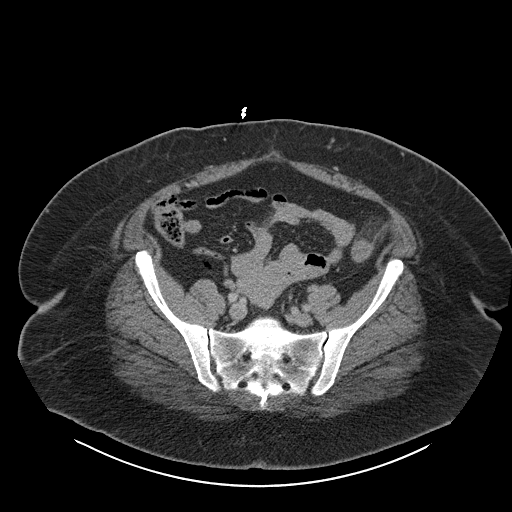
[im 38/88  soft-tissue]
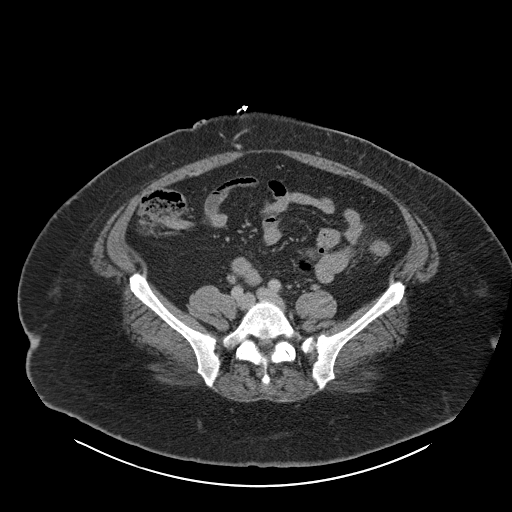
[im 44/88  soft-tissue]
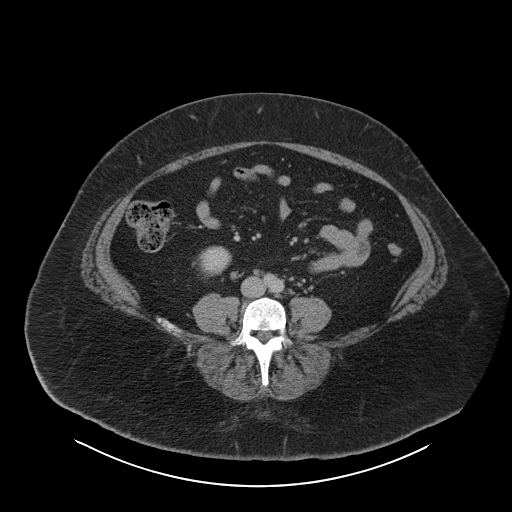
[im 50/88  soft-tissue]
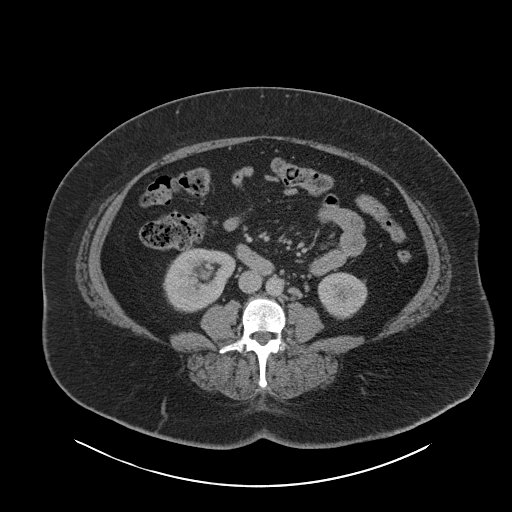
[im 56/88  soft-tissue]
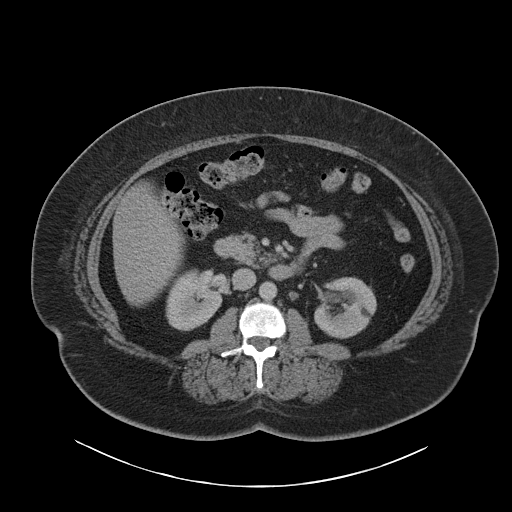
[im 56/88  bone]
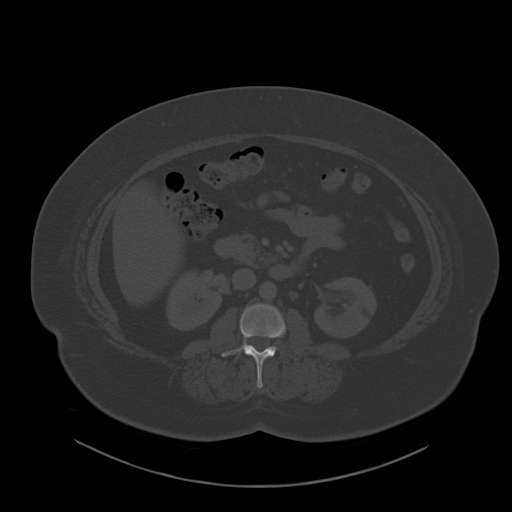
[im 63/88  soft-tissue]
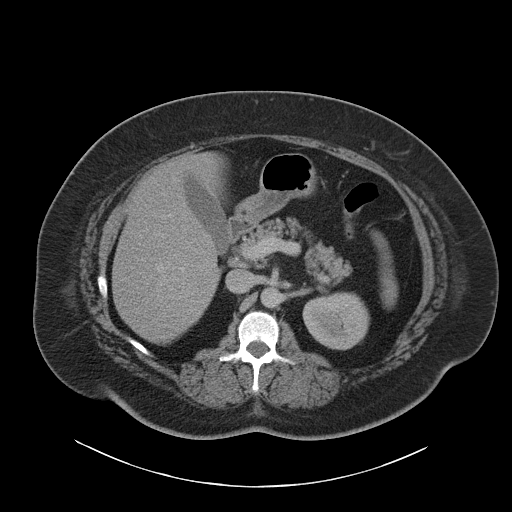
[im 69/88  soft-tissue]
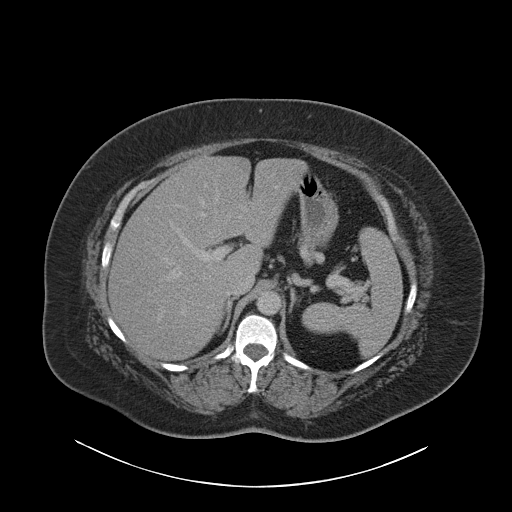
[im 75/88  soft-tissue]
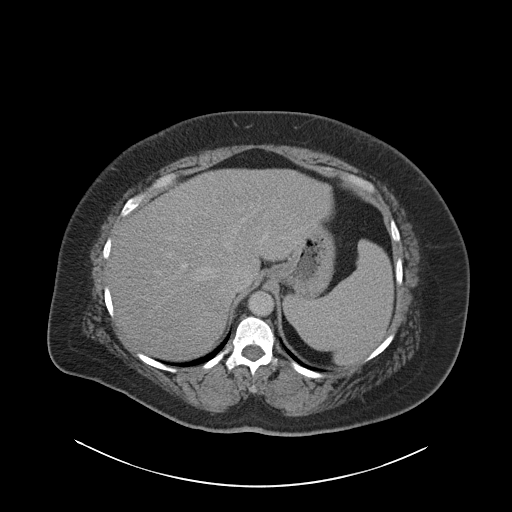
[im 81/88  soft-tissue]
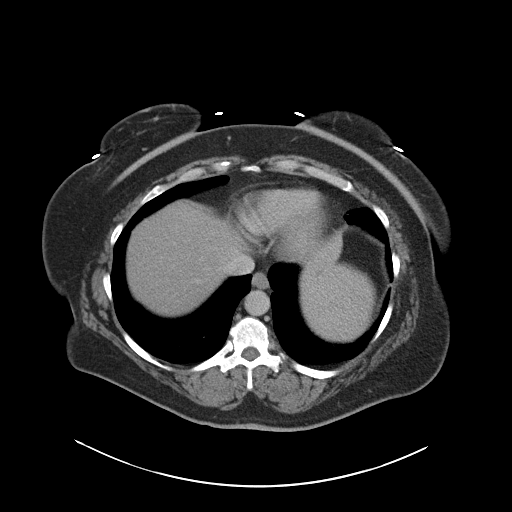

[Series 5: coronal st · coronal · 0.87mm/px · 3 of 193 slices shown]
[im 65/193  soft-tissue]
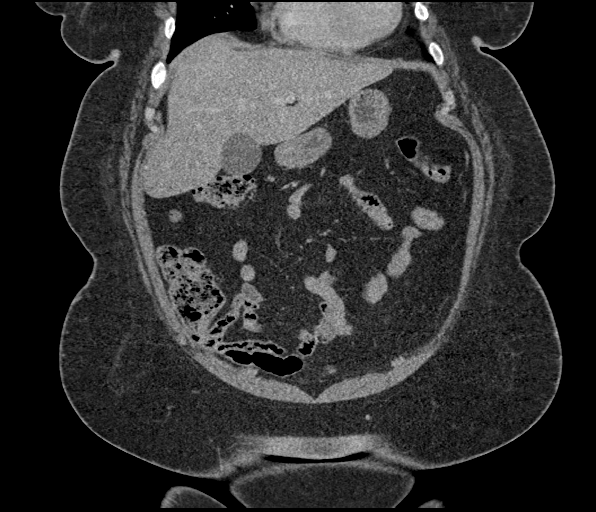
[im 86/193  soft-tissue]
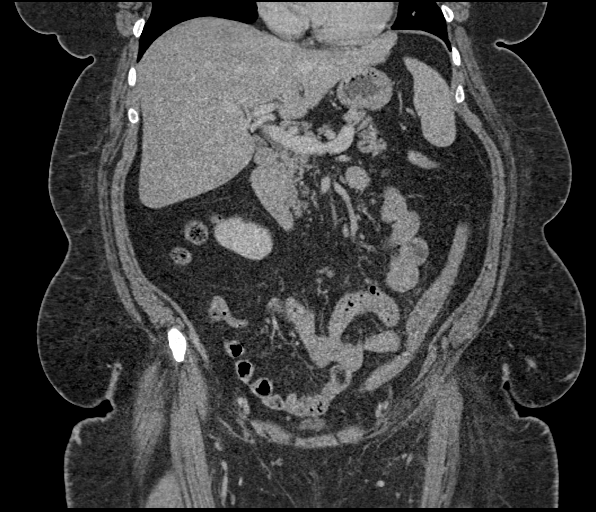
[im 107/193  soft-tissue]
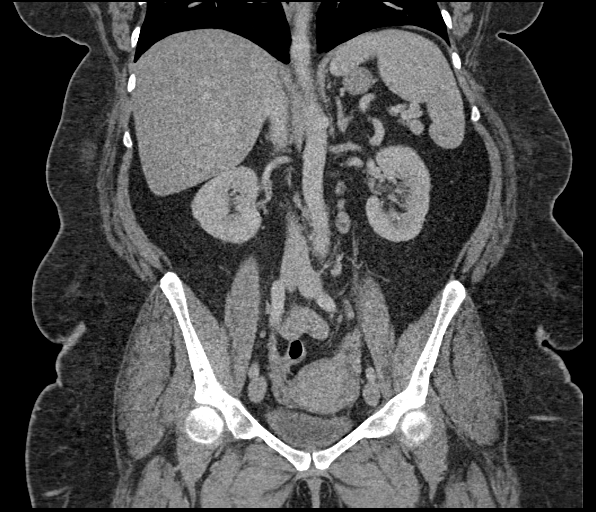

[16 of 46 positions shown; findings below may reference images not displayed]

RADIATION DOSE REDUCTION: This exam was performed according to the
departmental dose-optimization program which includes automated
exposure control, adjustment of the mA and/or kV according to
patient size and/or use of iterative reconstruction technique.

CONTRAST:  100mL OMNIPAQUE IOHEXOL 300 MG/ML  SOLN
FINDINGS: Lower chest: Lower right infrahilar node has diminished in size from
prior exam. No acute airspace disease or pleural effusion. The heart
is normal in size.

Hepatobiliary: Mild hepatic steatosis without focal hepatic lesion.
Gallbladder physiologically distended, no calcified stone. No
biliary dilatation.

Pancreas: No ductal dilatation or inflammation.

Spleen: Normal in size without focal abnormality. Splenule at the
hilum.

Adrenals/Urinary Tract: Normal adrenal glands. No hydronephrosis or
perinephric edema. Homogeneous renal enhancement with symmetric
excretion on delayed phase imaging. Punctate nonobstructing stone in
the lower left kidney. There is a 7 mm fat density lesion in the mid
lateral left kidney most consistent with small angiomyolipoma. No
dedicated follow-up is recommended. Urinary bladder is minimally
distended without wall thickening.

Stomach/Bowel: Inflamed diverticulum involving the distal
descending/sigmoid colon junction, series 2, image 55. Pericolonic
fat stranding with minimal focal colonic wall thickening.
Unremarkable appearance of the stomach.

Vascular/Lymphatic: Normal caliber abdominal aorta. Patent portal
and splenic veins. Multiple small retroperitoneal lymph nodes are
not enlarged by size criteria. No enlarged lymph nodes in the
abdomen or pelvis.

Reproductive: Uterus and bilateral adnexa are unremarkable.

Other: No free air, free fluid, or intra-abdominal fluid collection.
No abdominal wall hernia.

Musculoskeletal: There are no acute or suspicious osseous
abnormalities. There are scattered bone islands in the pelvis.
IMPRESSION: 1. Uncomplicated acute diverticulitis at the distal
descending/sigmoid colon junction.
2. Mild hepatic steatosis.
3. Punctate nonobstructing left renal stone.
